# Patient Record
Sex: Female | Born: 2000
Health system: Southern US, Community
[De-identification: ages and names within clinical notes are randomized; demographics above are authoritative.]

## PROBLEM LIST (undated history)

## (undated) ENCOUNTER — Emergency Department (HOSPITAL_COMMUNITY): Admission: EM | Payer: Federal, State, Local not specified - PPO | Source: Home / Self Care

## (undated) DIAGNOSIS — F329 Major depressive disorder, single episode, unspecified: Secondary | ICD-10-CM

## (undated) DIAGNOSIS — R51 Headache: Secondary | ICD-10-CM

## (undated) DIAGNOSIS — Z9109 Other allergy status, other than to drugs and biological substances: Secondary | ICD-10-CM

## (undated) DIAGNOSIS — T7840XA Allergy, unspecified, initial encounter: Secondary | ICD-10-CM

## (undated) DIAGNOSIS — R519 Headache, unspecified: Secondary | ICD-10-CM

## (undated) DIAGNOSIS — F909 Attention-deficit hyperactivity disorder, unspecified type: Secondary | ICD-10-CM

## (undated) DIAGNOSIS — L309 Dermatitis, unspecified: Secondary | ICD-10-CM

## (undated) DIAGNOSIS — F419 Anxiety disorder, unspecified: Secondary | ICD-10-CM

## (undated) DIAGNOSIS — F32A Depression, unspecified: Secondary | ICD-10-CM

## (undated) HISTORY — DX: Headache, unspecified: R51.9

## (undated) HISTORY — DX: Headache: R51

## (undated) HISTORY — PX: UMBILICAL HERNIA REPAIR: SHX196

## (undated) HISTORY — DX: Anxiety disorder, unspecified: F41.9

---
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
## 2022-01-11 ENCOUNTER — Ambulatory Visit: Payer: Federal, State, Local not specified - PPO | Admitting: Allergy

## 2022-02-01 NOTE — Progress Notes (Deleted)
10/04/2021  FOLLOW UP Date of Service/Encounter:  02/01/22   Subjective:  [name] (DOB: 27-Dec-2000) is a 21 y.o. female who returns to the [facility] [place] on 02/04/2022 in re-evaluation of the following:  severe asthma, allergic rhinitis with conjunctivitis and food allergy History obtained from: chart review and {Persons; PED relatives w/patient:19415::"patient"}.  For Review, LV was on 10/04/2021 with Dr. Nelva Bush seen for routine follow-up.  This is my first time meeting this patient.  At that visit her asthma had not been controlled and she had had 3 ED visits, 2 requiring subsequent hospitalization for asthma exacerbations.  Taking Nucala monthly injections and Breztri had become unaffordable.  Avoiding peanuts and tree nuts.  Allergic rhinitis controlled. It was recommended that she switch to Tezspire at that visit.  Interim history Per chart review: Patient was approved for test prior but never returned for any biologic treatment since March 2023.  Was a no-show for her appointment on 01/11/2022. Has had 3 ED visits-1 for constipation, 1 for migraine headache, and 1 for severe asthma on 11/02/2021.  Given magnesium, methylprednisolone, leave albuterol, and albuterol.  DuoNeb's x2 with EMS prior to arrival.  Wheezing noted on exam.  Hx of peanut/treenut allergy:  Hx of bee allergy: **  Today presents for follow-up. ***  Allergies as of 02/04/2022       Reactions   Bee Venom Shortness Of Breath   Eggs Or Egg-derived Products Shortness Of Breath   Other Anaphylaxis, Other (See Comments)   Nuts and Tree nuts Pet dander cats and dogs   Peanut-containing Drug Products Anaphylaxis   Pollen Extract Swelling        Medication List        Accurate as of February 01, 2022  5:08 PM. If you have any questions, ask your nurse or doctor.          Breztri Aerosphere 160-9-4.8 MCG/ACT Aero Generic drug: Budeson-Glycopyrrol-Formoterol Inhale 2 puffs into the lungs  in the morning and at bedtime.   Breztri Aerosphere 160-9-4.8 MCG/ACT Aero Generic drug: Budeson-Glycopyrrol-Formoterol Inhale 2 puffs into the lungs in the morning and at bedtime.   cetirizine 10 MG tablet Commonly known as: ZYRTEC Take 1 tablet (10 mg total) by mouth at bedtime.   cetirizine 10 MG tablet Commonly known as: ZYRTEC TAKE 1 TABLET BY MOUTH EVERY DAY   EPINEPHrine 0.3 mg/0.3 mL Soaj injection Commonly known as: EPI-PEN Inject 0.3 mg into the muscle as needed for anaphylaxis.   EpiPen 2-Pak 0.3 mg/0.3 mL Soaj injection Generic drug: EPINEPHrine Inject 0.3 mg into the muscle as needed for anaphylaxis.   fluticasone 50 MCG/ACT nasal spray Commonly known as: FLONASE Place 2 sprays into both nostrils daily.   fluticasone 50 MCG/ACT nasal spray Commonly known as: FLONASE 2 sprays per nostril daily for 1-2 weeks at a time before stopping once nasal congestion improves.   guanFACINE 1 MG Tb24 ER tablet Commonly known as: INTUNIV Take 1 tablet (1 mg total) by mouth at bedtime.   hydrOXYzine 25 MG tablet Commonly known as: ATARAX Take 1 tablet (25 mg total) by mouth 3 (three) times daily as needed for anxiety.   ipratropium-albuterol 0.5-2.5 (3) MG/3ML Soln Commonly known as: DUONEB Take it 2 times scheduled and every 8 hours as needed if needed for shortness of breath and wheezing.   mepolizumab 100 MG injection Commonly known as: NUCALA Inject 100 mg into the skin every 28 (twenty-eight) days.   olopatadine 0.1 % ophthalmic solution Commonly  known as: PATANOL Place 1 drop into both eyes 2 (two) times daily as needed for allergies (Itchy, watery eyes).   polyethylene glycol 17 g packet Commonly known as: MIRALAX / GLYCOLAX Take 17 g by mouth daily.   Ventolin HFA 108 (90 Base) MCG/ACT inhaler Generic drug: albuterol INHALE 2 PUFFS INTO THE LUNGS EVERY 4 HOURS AS NEEDED FOR WHEEZING OR SHORTNESS OF BREATH.   Vyvanse 60 MG capsule Generic drug:  lisdexamfetamine Take 1 capsule (60 mg total) by mouth every morning.       Past Medical History:  Diagnosis Date   ADHD    Allergy    Anxiety    Asthma    Depression    Eczema    Environmental allergies    Headache    Past Surgical History:  Procedure Laterality Date   GANGLION CYST EXCISION Right 04/26/2021   Procedure: right dorsal carpal ganglion cyst excision;  Surgeon: Orene Desanctis, MD;  Location: [place];  Service: Orthopedics;  Laterality: Right;  with MAC anesthesia needs 30 minutes   INCISION AND DRAINAGE WOUND WITH NAILBED REPAIR Left 04/26/2021   Procedure: INCISION AND DRAINAGE WOUND WITH NAILBED REPAIR LEFT MIDDLE FINGER;  Surgeon: Orene Desanctis, MD;  Location: [place];  Service: Orthopedics;  Laterality: Left;   UMBILICAL HERNIA REPAIR     Otherwise, there have been no changes to her past medical history, surgical history, family history, or social history.  ROS: All others negative except as noted per HPI.   Objective:  There were no vitals taken for this visit. There is no height or weight on file to calculate BMI. Physical Exam: General Appearance:  Alert, cooperative, no distress, appears stated age  Head:  Normocephalic, without obvious abnormality, atraumatic  Eyes:  Conjunctiva clear, EOM's intact  Nose: Nares normal, {Blank multiple:19196:a:"***","hypertrophic turbinates","normal mucosa","no visible anterior polyps","septum midline"}  Throat: Lips, tongue normal; teeth and gums normal, {Blank multiple:19196:a:"***","normal posterior oropharynx","tonsils 2+","tonsils 3+","no tonsillar exudate","+ cobblestoning"}  Neck: Supple, symmetrical  Lungs:   {Blank multiple:19196:a:"***","clear to auscultation bilaterally","end-expiratory wheezing","wheezing throughout"}, Respirations unlabored, {Blank multiple:19196:a:"***","no coughing","intermittent dry coughing"}  Heart:  {Blank multiple:19196:a:"***","regular rate and rhythm","no murmur"}, Appears well perfused   Extremities: No edema  Skin: Skin color, texture, turgor normal, no rashes or lesions on visualized portions of skin  Neurologic: No gross deficits   Reviewed: ***  Spirometry:  Tracings reviewed. Her effort: {Blank single:19197::"Good reproducible efforts.","It was hard to get consistent efforts and there is a question as to whether this reflects a maximal maneuver.","Poor effort, data can not be interpreted.","Variable effort-results affected.","decent for first attempt at spirometry."} FVC: ***L FEV1: ***L, ***% predicted FEV1/FVC ratio: ***% Interpretation: {Blank single:19197::"Spirometry consistent with mild obstructive disease","Spirometry consistent with moderate obstructive disease","Spirometry consistent with severe obstructive disease","Spirometry consistent with possible restrictive disease","Spirometry consistent with mixed obstructive and restrictive disease","Spirometry uninterpretable due to technique","Spirometry consistent with normal pattern","No overt abnormalities noted given today's efforts"}.  Please see scanned spirometry results for details.  Skin Testing: {Blank single:19197::"Select foods","Environmental allergy panel","Environmental allergy panel and select foods","Food allergy panel","None","Deferred due to recent antihistamines use","deferred due to recent reaction"}. ***Adequate positive and negative controls Results discussed with patient/family.   {Blank single:19197::"Allergy testing results were read and interpreted by myself, documented by clinical staff."," "}  Assessment/Plan   ***  Sigurd Sos, MD  [facility] [address] [facility]

## 2022-02-04 ENCOUNTER — Ambulatory Visit: Payer: Federal, State, Local not specified - PPO | Admitting: Internal Medicine

## 2022-02-09 ENCOUNTER — Emergency Department (HOSPITAL_BASED_OUTPATIENT_CLINIC_OR_DEPARTMENT_OTHER)
Admission: EM | Admit: 2022-02-09 | Discharge: 2022-02-09 | Disposition: A | Discharge status: Home / Self Care | Payer: Federal, State, Local not specified - PPO | Attending: Emergency Medicine | Admitting: Emergency Medicine

## 2022-02-09 ENCOUNTER — Other Ambulatory Visit: Payer: Self-pay

## 2022-02-09 ENCOUNTER — Encounter (HOSPITAL_BASED_OUTPATIENT_CLINIC_OR_DEPARTMENT_OTHER): Payer: Self-pay | Admitting: Emergency Medicine

## 2022-02-09 ENCOUNTER — Other Ambulatory Visit: Payer: Self-pay | Admitting: Allergy

## 2022-02-09 DIAGNOSIS — Z91010 Allergy to peanuts: Secondary | ICD-10-CM | POA: Insufficient documentation

## 2022-02-09 DIAGNOSIS — J45909 Unspecified asthma, uncomplicated: Secondary | ICD-10-CM | POA: Insufficient documentation

## 2022-02-09 DIAGNOSIS — Z7951 Long term (current) use of inhaled steroids: Secondary | ICD-10-CM | POA: Diagnosis not present

## 2022-02-09 MED ORDER — PREDNISONE 50 MG PO TABS: ORAL_TABLET | ORAL | 0 refills | Status: DC

## 2022-02-09 MED ORDER — IPRATROPIUM-ALBUTEROL 0.5-2.5 (3) MG/3ML IN SOLN
RESPIRATORY_TRACT | Status: AC
  Administered 2022-02-09: 3 mL via RESPIRATORY_TRACT
  Filled 2022-02-09: qty 3

## 2022-02-09 MED ORDER — PREDNISONE 50 MG PO TABS
60.0000 mg | ORAL_TABLET | Freq: Once | ORAL | Status: AC
  Administered 2022-02-09: 60 mg via ORAL

## 2022-02-09 MED ORDER — PREDNISONE 50 MG PO TABS
60.0000 mg | ORAL_TABLET | Freq: Every day | ORAL | Status: DC
  Filled 2022-02-09: qty 1

## 2022-02-09 MED ORDER — ALBUTEROL SULFATE HFA 108 (90 BASE) MCG/ACT IN AERS: 2.0000 | INHALATION_SPRAY | Freq: Four times a day (QID) | RESPIRATORY_TRACT | 2 refills | Status: DC | PRN

## 2022-02-09 MED ORDER — IPRATROPIUM-ALBUTEROL 0.5-2.5 (3) MG/3ML IN SOLN: 3.0000 mL | Freq: Once | RESPIRATORY_TRACT | Status: AC

## 2022-02-09 MED ORDER — ALBUTEROL SULFATE HFA 108 (90 BASE) MCG/ACT IN AERS
2.0000 | INHALATION_SPRAY | RESPIRATORY_TRACT | Status: DC
  Administered 2022-02-09: 2 via RESPIRATORY_TRACT
  Filled 2022-02-09: qty 6.7

## 2022-02-09 NOTE — ED Provider Notes (Signed)
MEDCENTER [facility] EMERGENCY DEPT Provider Note   CSN: [id number] Arrival date & time: 02/09/22  1358     History  Chief Complaint  Patient presents with   Asthma    Jacqueline [name] is a 21 y.o. female.  Pt complains of having an asthma attack.  Pt reports she ran out of her albuterol inhaler.  Patient was given a breathing treatment by respiratory therapy prior to my evaluation she states that she feels back to normal.  Patient reports she feels like she may have an upper respiratory infection that triggered her asthma attack.  She reports she is out of her other inhaler as well.  She states she cannot afford to get it.  Patient states she can contact her doctor on Monday who normally gives her samples.  The history is provided by the patient. No language interpreter was used.  Asthma This is a recurrent problem. The problem occurs constantly. Nothing aggravates the symptoms. Nothing relieves the symptoms.       Home Medications Prior to Admission medications   Medication Sig Start Date End Date Taking? Authorizing Provider  albuterol (VENTOLIN HFA) 108 (90 Base) MCG/ACT inhaler Inhale 2 puffs into the lungs every 6 (six) hours as needed for wheezing or shortness of breath. 02/09/22  Yes Harli Engelken K, PA-C  BREZTRI AEROSPHERE 160-9-4.8 MCG/ACT AERO Inhale 2 puffs into the lungs in the morning and at bedtime. Patient not taking: Reported on 10/04/2021 05/03/21   Marcelyn Bruins, MD  BREZTRI AEROSPHERE 160-9-4.8 MCG/ACT AERO Inhale 2 puffs into the lungs in the morning and at bedtime. 10/04/21   Marcelyn Bruins, MD  cetirizine (ZYRTEC) 10 MG tablet Take 1 tablet (10 mg total) by mouth at bedtime. 09/16/21 12/15/21  Leroy Sea, MD  cetirizine (ZYRTEC) 10 MG tablet TAKE 1 TABLET BY MOUTH EVERY DAY 10/22/21   Marcelyn Bruins, MD  EPINEPHrine 0.3 mg/0.3 mL IJ SOAJ injection Inject 0.3 mg into the muscle as needed for anaphylaxis. 05/03/21    Marcelyn Bruins, MD  EPIPEN 2-PAK 0.3 MG/0.3ML SOAJ injection Inject 0.3 mg into the muscle as needed for anaphylaxis. 10/04/21   Marcelyn Bruins, MD  fluticasone (FLONASE) 50 MCG/ACT nasal spray Place 2 sprays into both nostrils daily. 07/29/21   [provider]  fluticasone (FLONASE) 50 MCG/ACT nasal spray 2 sprays per nostril daily for 1-2 weeks at a time before stopping once nasal congestion improves. 10/04/21   Marcelyn Bruins, MD  guanFACINE (INTUNIV) 1 MG TB24 ER tablet Take 1 tablet (1 mg total) by mouth at bedtime. Patient not taking: Reported on 09/15/2021 05/21/21   Karsten Ro, MD  hydrOXYzine (ATARAX/VISTARIL) 25 MG tablet Take 1 tablet (25 mg total) by mouth 3 (three) times daily as needed for anxiety. 09/01/18   Verneda Skill, FNP  ipratropium-albuterol (DUONEB) 0.5-2.5 (3) MG/3ML SOLN Take it 2 times scheduled and every 8 hours as needed if needed for shortness of breath and wheezing. 09/16/21   Leroy Sea, MD  mepolizumab (NUCALA) 100 MG injection Inject 100 mg into the skin every 28 (twenty-eight) days.    [provider]  olopatadine (PATANOL) 0.1 % ophthalmic solution Place 1 drop into both eyes 2 (two) times daily as needed for allergies (Itchy, watery eyes). 10/04/21   Marcelyn Bruins, MD  polyethylene glycol (MIRALAX / GLYCOLAX) 17 g packet Take 17 g by mouth daily. 10/22/21   Horton, Mayer Masker, MD  VYVANSE 60 MG capsule Take 1  capsule (60 mg total) by mouth every morning. 05/21/21   Karsten Ro, MD      Allergies    Bee venom, Eggs or egg-derived products, Other, Peanut-containing drug products, and Pollen extract    Review of Systems   Review of Systems  All other systems reviewed and are negative.   Physical Exam Updated Vital Signs BP 117/81   Pulse 99   Temp 98.8 F (37.1 C)   Resp 18   SpO2 98%  Physical Exam Vitals and nursing note reviewed.  Constitutional:      Appearance: She is  well-developed.  HENT:     Head: Normocephalic.     Mouth/Throat:     Mouth: Mucous membranes are moist.  Cardiovascular:     Rate and Rhythm: Normal rate and regular rhythm.  Pulmonary:     Effort: Pulmonary effort is normal.  Abdominal:     General: Abdomen is flat. There is no distension.  Musculoskeletal:        General: Normal range of motion.     Cervical back: Normal range of motion.  Skin:    General: Skin is warm.  Neurological:     Mental Status: She is alert and oriented to person, place, and time.     ED Results / Procedures / Treatments   Labs (all labs ordered are listed, but only abnormal results are displayed) Labs Reviewed - No data to display  EKG None  Radiology No results found.  Procedures Procedures    Medications Ordered in ED Medications  predniSONE (DELTASONE) tablet 60 mg (has no administration in time range)  albuterol (VENTOLIN HFA) 108 (90 Base) MCG/ACT inhaler 2 puff (has no administration in time range)  ipratropium-albuterol (DUONEB) 0.5-2.5 (3) MG/3ML nebulizer solution 3 mL (3 mLs Nebulization Given 02/09/22 1427)    ED Course/ Medical Decision Making/ A&P                           Medical Decision Making Patient complains of an asthma attack  Amount and/or Complexity of Data Reviewed External Data Reviewed: notes.    Details: Notes reviewed from patient's allergist  Risk Prescription drug management. Risk Details: Patient is feeling back to normal after receiving nebulization here patient is given an albuterol inhaler here.  Given 60 mg of prednisone patient is given prednisone 50 mg x 5 days.  She is advised to follow-up with her physician for recheck next week.           Final Clinical Impression(s) / ED Diagnoses Final diagnoses:  Moderate asthma without complication, unspecified whether persistent    Rx / DC Orders ED Discharge Orders          Ordered    albuterol (VENTOLIN HFA) 108 (90 Base) MCG/ACT  inhaler  Every 6 hours PRN        02/09/22 1641    predniSONE (DELTASONE) 50 MG tablet        02/09/22 1646              Elson Areas, [place] 02/09/22 1646    Rolan Bucco, MD 02/09/22 [phone]

## 2022-02-09 NOTE — ED Triage Notes (Signed)
Pt has been having exacerbation of her asthma for 2 days and her inhalers are not helping.sore throat

## 2022-02-09 NOTE — Discharge Instructions (Addendum)
See your Physicain for recheck this week  

## 2022-02-14 ENCOUNTER — Ambulatory Visit (INDEPENDENT_AMBULATORY_CARE_PROVIDER_SITE_OTHER): Payer: Federal, State, Local not specified - PPO | Admitting: Pulmonary Disease

## 2022-02-14 ENCOUNTER — Encounter: Payer: Self-pay | Admitting: Pulmonary Disease

## 2022-02-14 VITALS — BP 118/72 | HR 97 | Temp 98.4°F | Ht 62.0 in | Wt 178.6 lb

## 2022-02-14 DIAGNOSIS — J4550 Severe persistent asthma, uncomplicated: Secondary | ICD-10-CM

## 2022-02-14 MED ORDER — BREZTRI AEROSPHERE 160-9-4.8 MCG/ACT IN AERO: 2.0000 | INHALATION_SPRAY | Freq: Two times a day (BID) | RESPIRATORY_TRACT | 0 refills | Status: DC

## 2022-02-14 NOTE — Progress Notes (Deleted)
Anxiety increases flares Smoked starting 16, stopped smoking 2 months ago. 1 black and mild per day No vaping, other substances  Albuterol >5x day Has been out of breztri for 2-3 months, can't get at her pharmacy due to cost. Had samples and missed that appointment so hasn't been able to get more [place]", moving in early January is her hope [place] Having trouble with nucala due to insurance. Had samples for a bit but no longer. Has been since April/may Not doing tezspire Nucala did help when breztri was getting as it should  Never intubated 4-5/7 days per week, not really having problems at night symptom wise  Has severe allergies, has an epi pen Does have problems with GI upset  Mom has COPD. 13 siblings, all have asthma. Bad in dads side too.  Triggers: weather change, worse when it gets hot. Environmental allergies. Can exercise but tolerance is not great.  If well medicated cold do 40m-hr cardio. Now could tolerate walking.   Cough and wheezing a lot, increased sputum production.  Doesn't get resp infections a lot. Typically happens only during season change.   Didn't get covid shots, hasn't gotten flu shot yet (of course its July)

## 2022-02-14 NOTE — Patient Instructions (Signed)
Thank you for visiting Dr. Tonia Brooms at [facility]. Today we recommend the following:  Breztri samples  [place] application financial aid application  Tezepelumab application and financial aid  Return in about 6 weeks (around 03/28/2022).    Please do your part to reduce the spread of COVID-19.

## 2022-02-14 NOTE — Progress Notes (Signed)
Synopsis: Referred in February 2023 for severe persistent asthma by Dr. Candiss Norse  Subjective:   PATIENT ID: Jacqueline [name] GENDER: female DOB: [birth year], MRN: [id number]  Chief Complaint  Patient presents with   Consult    She was told she was born with Asthma, she has come chest tightness, shortness of breath with exertion and wheezing.     Patient presents today for a referral due to severe persistent asthma. She has been evaluated in the emergency department seven times over the last 1 year for her asthma.   She smoked around 1 black and mild daily from age 58 until around two months ago. She denies vape or other substance use.   Jacqueline [name] explains that when she has asthma flares she also gets very anxious and often goes to the hospital because the anxiety makes her breathing during the flare much worse. She was on Tezpire at one point in time which was not very helpful in symptom management. She then was on Nucala injections but because she wasn't following up with her allergy/asthma clinic regularly, her insurance stopped covering it. Since she has been making an effort to go more regularly she is still not able to get insurance coverage for the treatment. She has been using Breztri and albuterol though has been out of [place] for 2-3 months due to cost. She cannot afford it at her pharmacy as it is $100. She has been able to get samples in the past however she has not been to her allergy/asthma physician in some time. She is also currently out of her albuterol inhaler but was using it 5 or more times per day before it ran out. She states that the combination of Nucala and Breztri did a great job at controlling her symptoms.   She has never been intubated due to respiratory distress or failure. She reports symptoms 4 to 5 days per week with infrequent nighttime symptoms.   She has severe allergies and has an Epi Pen. She also reports gastrointestinal issues such as diarrhea. She has never  been tested for autoimmune diseases and does not have cystic fibrosis.   Her triggers are weather change, particularly from cool to warm weather, as well as environmental allergies. When well controlled she believes she could tolerate 30 minutes-1 hour of cardiovascular activity. At this time she is not adequately treated and would only be able to tolerate walking. Taking the stairs exhausts her.   She has a productive cough and increased sputum production with significant wheezing. She denies frequent respiratory infections, stating that these typically only occur in times of season change. She did not receive the COVID-19 vaccines or boosters or flu shot.   Her entire family is affected by respiratory illness. Her mom has COPD and her dad's side of the family has a heavy asthma presence. She has 13 siblings and every one of them has asthma.  She was in [employer] in high school and hopes to move to the [place] area in early January to join the [employer].Marland Kitchene    Past Medical History:  Diagnosis Date   ADHD    Allergy    Anxiety    Asthma    Depression    Eczema    Environmental allergies    Headache      Family History  Problem Relation Age of Onset   Allergic rhinitis Mother    Asthma Mother    COPD Mother    Migraines Mother  Allergic rhinitis Father    Asthma Father    Schizophrenia Father    Asthma Sister    Asthma Brother    Migraines Brother      Past Surgical History:  Procedure Laterality Date   GANGLION CYST EXCISION Right 04/26/2021   Procedure: right dorsal carpal ganglion cyst excision;  Surgeon: Orene Desanctis, MD;  Location: [facility];  Service: Orthopedics;  Laterality: Right;  with MAC anesthesia needs 30 minutes   INCISION AND DRAINAGE WOUND WITH NAILBED REPAIR Left 04/26/2021   Procedure: INCISION AND DRAINAGE WOUND WITH NAILBED REPAIR LEFT MIDDLE FINGER;  Surgeon: Orene Desanctis, MD;  Location: [facility];  Service: Orthopedics;  Laterality: Left;   UMBILICAL  HERNIA REPAIR      Social History   Socioeconomic History   Marital status: Single    Spouse name: Not on file   Number of children: Not on file   Years of education: Not on file   Highest education level: Not on file  Occupational History   Not on file  Tobacco Use   Smoking status: Former    Packs/day: 0.25    Years: 4.00    Total pack years: 1.00    Types: Cigarettes, Cigars    Quit date: 11/06/2019    Years since quitting: 2.2   Smokeless tobacco: Never   Tobacco comments:    Patient smokes black n milds  Vaping Use   Vaping Use: Never used  Substance and Sexual Activity   Alcohol use: Not Currently    Comment: has a drink occasionally   Drug use: Yes    Types: Marijuana    Comment: rarely   Sexual activity: Yes    Birth control/protection: None    Comment: Pt states she is a lesbian; no protection used   Other Topics Concern   Not on file  Social History Narrative   Lives with mother, brother, sister. Shitzu in house. 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   She attends [employer]. She does ok in school.    She enjoys ROTC and basketball.   Social Determinants of Health   Financial Resource Strain: Low Risk  (04/17/2019)   Overall Financial Resource Strain (CARDIA)    Difficulty of Paying Living Expenses: Not hard at all  Food Insecurity: No Food Insecurity (04/17/2019)   Hunger Vital Sign    Worried About Running Out of Food in the Last Year: Never true    Ran Out of Food in the Last Year: Never true  Transportation Needs: No Transportation Needs (04/17/2019)   PRAPARE - Hydrologist (Medical): No    Lack of Transportation (Non-Medical): No  Physical Activity: Unknown (04/17/2019)   Exercise Vital Sign    Days of Exercise per Week: Patient refused    Minutes of Exercise per Session: Patient refused  Stress: No Stress Concern Present (04/17/2019)   [place]     Feeling of Stress : Not at all  Social Connections: Unknown (04/17/2019)   Social Connection and Isolation Panel [NHANES]    Frequency of Communication with Friends and Family: More than three times a week    Frequency of Social Gatherings with Friends and Family: More than three times a week    Attends Religious Services: Patient refused    Active Member of Clubs or Organizations: No    Attends Archivist Meetings: Never    Marital Status: Never married  Intimate Partner Violence: Not At Risk (04/17/2019)   Humiliation, Afraid, Rape, and Kick questionnaire    Fear of Current or Ex-Partner: No    Emotionally Abused: No    Physically Abused: No    Sexually Abused: No     Allergies  Allergen Reactions   Bee Venom Shortness Of Breath   Eggs Or Egg-Derived Products Shortness Of Breath   Other Anaphylaxis and Other (See Comments)    Nuts and Tree nuts Pet dander cats and dogs   Peanut-Containing Drug Products Anaphylaxis   Pollen Extract Swelling     Outpatient Medications Prior to Visit  Medication Sig Dispense Refill   albuterol (VENTOLIN HFA) 108 (90 Base) MCG/ACT inhaler Inhale 2 puffs into the lungs every 6 (six) hours as needed for wheezing or shortness of breath. 8 g 2   cetirizine (ZYRTEC) 10 MG tablet TAKE 1 TABLET BY MOUTH EVERY DAY 90 tablet 1   EPIPEN 2-PAK 0.3 MG/0.3ML SOAJ injection Inject 0.3 mg into the muscle as needed for anaphylaxis. 1 each 1   fluticasone (FLONASE) 50 MCG/ACT nasal spray 2 sprays per nostril daily for 1-2 weeks at a time before stopping once nasal congestion improves. 1 g 5   guanFACINE (INTUNIV) 1 MG TB24 ER tablet Take 1 tablet (1 mg total) by mouth at bedtime. 30 tablet 0   hydrOXYzine (ATARAX/VISTARIL) 25 MG tablet Take 1 tablet (25 mg total) by mouth 3 (three) times daily as needed for anxiety. 30 tablet 1   ipratropium-albuterol (DUONEB) 0.5-2.5 (3) MG/3ML SOLN Take it 2 times scheduled and every 8 hours as needed if needed for  shortness of breath and wheezing. 360 mL 0   mepolizumab (NUCALA) 100 MG injection Inject 100 mg into the skin every 28 (twenty-eight) days.     olopatadine (PATANOL) 0.1 % ophthalmic solution Place 1 drop into both eyes 2 (two) times daily as needed for allergies (Itchy, watery eyes). 5 mL 5   polyethylene glycol (MIRALAX / GLYCOLAX) 17 g packet Take 17 g by mouth daily. 14 each 0   VENTOLIN HFA 108 (90 Base) MCG/ACT inhaler INHALE 2 PUFFS BY MOUTH EVERY 4 HOURS AS NEEDED FOR WHEEZE OR FOR SHORTNESS OF BREATH 18 each 2   VYVANSE 60 MG capsule Take 1 capsule (60 mg total) by mouth every morning. 30 capsule 0   BREZTRI AEROSPHERE 160-9-4.8 MCG/ACT AERO Inhale 2 puffs into the lungs in the morning and at bedtime. (Patient not taking: Reported on 02/14/2022) 10.7 g 5   BREZTRI AEROSPHERE 160-9-4.8 MCG/ACT AERO Inhale 2 puffs into the lungs in the morning and at bedtime. (Patient not taking: Reported on 02/14/2022) 10.7 g 2   cetirizine (ZYRTEC) 10 MG tablet Take 1 tablet (10 mg total) by mouth at bedtime. 90 tablet 0   EPINEPHrine 0.3 mg/0.3 mL IJ SOAJ injection Inject 0.3 mg into the muscle as needed for anaphylaxis. (Patient not taking: Reported on 02/14/2022) 4 each 2   fluticasone (FLONASE) 50 MCG/ACT nasal spray Place 2 sprays into both nostrils daily. (Patient not taking: Reported on 02/14/2022)     predniSONE (DELTASONE) 50 MG tablet One tablet a day (Patient not taking: Reported on 02/14/2022) 5 tablet 0   Facility-Administered Medications Prior to Visit  Medication Dose Route Frequency Provider Last Rate Last Admin   mepolizumab (NUCALA) injection 100 mg  100 mg Subcutaneous Q28 days Kennith Gain, MD   100 mg at 08/22/21 1445   tezepelumab-ekko (TEZSPIRE) 210 MG/1.91ML syringe 210 mg  210  mg Subcutaneous Q28 days Kennith Gain, MD   210 mg at 10/04/21 1149    Review of Systems  Constitutional:  Positive for malaise/fatigue. Negative for chills and fever.  Respiratory:   Positive for cough, sputum production, shortness of breath and wheezing.   Cardiovascular:  Positive for PND.  Gastrointestinal:  Positive for diarrhea.  Endo/Heme/Allergies:  Positive for environmental allergies.  Psychiatric/Behavioral:  The patient is nervous/anxious.      Objective:  Constitutional:Appears stated age, appears uncomfortable but in no acute distress.  Cardio:Regular rate and rhythm. No murmurs, rubs, or gallops. Pulm:Diffuse inspiratory squawking with expiratory wheezing. On room air.  ZOX:WRUEAVWU for extremity edema. No nail clubbing. Skin:Warm and dry. Neuro:Awake, alert, no focal deficit noted. Psych:Normal mood and affect.   Vitals:   02/14/22 1345  BP: 118/72  Pulse: 97  Temp: 98.4 F (36.9 C)  TempSrc: Oral  SpO2: 97%  Weight: 178 lb 9.6 oz (81 kg)  Height: _0  (1.575 m)   97% on RA BMI Readings from Last 3 Encounters:  02/14/22 32.67 kg/m (96 %, Z= 1.76)*  11/28/21 32.66 kg/m (96 %, Z= 1.77)*  11/02/21 32.66 kg/m (96 %, Z= 1.77)*   * Growth percentiles are based on CDC (Girls, 2-20 Years) data.   Wt Readings from Last 3 Encounters:  02/14/22 178 lb 9.6 oz (81 kg) (95 %, Z= 1.63)*  11/28/21 178 lb 9.2 oz (81 kg) (95 %, Z= 1.64)*  11/02/21 178 lb 9.2 oz (81 kg) (95 %, Z= 1.64)*   * Growth percentiles are based on CDC (Girls, 2-20 Years) data.     CBC    Component Value Date/Time   WBC 7.7 10/21/2021 2341   RBC 4.21 10/21/2021 2341   HGB 11.9 (L) 10/21/2021 2341   HGB 13.8 05/03/2021 1754   HCT 36.2 10/21/2021 2341   HCT 40.7 05/03/2021 1754   PLT 298 10/21/2021 2341   PLT 365 12/01/2015 1001   MCV 86.0 10/21/2021 2341   MCV 87 05/03/2021 1754   MCH 28.3 10/21/2021 2341   MCHC 32.9 10/21/2021 2341   RDW 12.3 10/21/2021 2341   RDW 13.2 05/03/2021 1754   LYMPHSABS 2.9 10/21/2021 2341   LYMPHSABS 1.6 05/03/2021 1754   MONOABS 0.5 10/21/2021 2341   EOSABS 0.1 10/21/2021 2341   EOSABS 0.1 05/03/2021 1754   BASOSABS 0.0  10/21/2021 2341   BASOSABS 0.0 05/03/2021 1754    Chest Imaging: Most recent chest x-ray in February 2023 showed no acute process.  Pulmonary Functions Testing Results:     No data to display          Echocardiogram: June 2018 -Levocardia. Normally related great arteries.  -Normal return of IVC to RA. SVC not well seen. Pulmonary veins not well seen.  -Normal right atrial size. Normal left atrial size.  -Atrial septum not well visualized.  -Normal tricuspid valve. Mild tricuspid regurgitation.  -Normal mitral valve. Normal mitral inflow. Trivial mitral regurgitation.  -Normal right ventricular size and systolic function.  -Normal left ventricular size and systolic function.  -No ventricular septal defect detected.  -Normal pulmonary valve. No pulmonary stenosis. Trivial pulmonary insufficiency.  -No aortic stenosis or insufficiency.  -Normal pulmonary arteries.  -No evidence of a patent ductus arteriosus.  -Normal appearing origin of the left coronary artery by 2D imaging, not confirmed by color Doppler imaging. Right coronary artery not well seen.  -Non-obstructive flow pattern in the aortic arch.  -No pericardial effusion.      Assessment &  Plan:     ICD-10-CM   1. Severe persistent asthma without complication  E99.37       Discussion: Jacqueline [name] presents today for severe persistent asthma. It seems that running out of her medications and non-compliance with scheduled follow-ups is contributing most to her symptoms and recurrent flares. When a flare starts she has overwhelming anxiety which further exacerbates her symptoms. She reports financial constraints for obtaining her medications which may explain appointment non-compliance as well, however I think that her age and lack of understanding of the severity of her disease is contributing most.. She is no longer smoking and has been counseled on how dangerous smoking with her disease is. -Patient counseled on  seasonal flu vaccine -Home with Breztri samples today -She will fill out financial assistance forms for [employer] and tezepelumab   Current Outpatient Medications:    albuterol (VENTOLIN HFA) 108 (90 Base) MCG/ACT inhaler, Inhale 2 puffs into the lungs every 6 (six) hours as needed for wheezing or shortness of breath., Disp: 8 g, Rfl: 2   Budeson-Glycopyrrol-Formoterol (BREZTRI AEROSPHERE) 160-9-4.8 MCG/ACT AERO, Inhale 2 puffs into the lungs in the morning and at bedtime., Disp: 10.7 g, Rfl: 0   cetirizine (ZYRTEC) 10 MG tablet, TAKE 1 TABLET BY MOUTH EVERY DAY, Disp: 90 tablet, Rfl: 1   EPIPEN 2-PAK 0.3 MG/0.3ML SOAJ injection, Inject 0.3 mg into the muscle as needed for anaphylaxis., Disp: 1 each, Rfl: 1   fluticasone (FLONASE) 50 MCG/ACT nasal spray, 2 sprays per nostril daily for 1-2 weeks at a time before stopping once nasal congestion improves., Disp: 1 g, Rfl: 5   guanFACINE (INTUNIV) 1 MG TB24 ER tablet, Take 1 tablet (1 mg total) by mouth at bedtime., Disp: 30 tablet, Rfl: 0   hydrOXYzine (ATARAX/VISTARIL) 25 MG tablet, Take 1 tablet (25 mg total) by mouth 3 (three) times daily as needed for anxiety., Disp: 30 tablet, Rfl: 1   ipratropium-albuterol (DUONEB) 0.5-2.5 (3) MG/3ML SOLN, Take it 2 times scheduled and every 8 hours as needed if needed for shortness of breath and wheezing., Disp: 360 mL, Rfl: 0   mepolizumab (NUCALA) 100 MG injection, Inject 100 mg into the skin every 28 (twenty-eight) days., Disp: , Rfl:    olopatadine (PATANOL) 0.1 % ophthalmic solution, Place 1 drop into both eyes 2 (two) times daily as needed for allergies (Itchy, watery eyes)., Disp: 5 mL, Rfl: 5   polyethylene glycol (MIRALAX / GLYCOLAX) 17 g packet, Take 17 g by mouth daily., Disp: 14 each, Rfl: 0   VENTOLIN HFA 108 (90 Base) MCG/ACT inhaler, INHALE 2 PUFFS BY MOUTH EVERY 4 HOURS AS NEEDED FOR WHEEZE OR FOR SHORTNESS OF BREATH, Disp: 18 each, Rfl: 2   VYVANSE 60 MG capsule, Take 1 capsule (60 mg total) by  mouth every morning., Disp: 30 capsule, Rfl: 0  Current Facility-Administered Medications:    mepolizumab (NUCALA) injection 100 mg, 100 mg, Subcutaneous, Q28 days, Kennith Gain, MD, 100 mg at 08/22/21 1445   tezepelumab-ekko (TEZSPIRE) 210 MG/1.91ML syringe 210 mg, 210 mg, Subcutaneous, Q28 days, Kennith Gain, MD, 210 mg at 10/04/21 1149  I spent 60 minutes dedicated to the care of this patient on the date of this encounter to include pre-visit review of records, face-to-face time with the patient discussing conditions above, post visit ordering of testing, clinical documentation with the electronic health record, making appropriate referrals as documented, and communicating necessary findings to members of the patients care team.   Farrel Gordon, DO  [facility] 02/14/2022 2:47 PM

## 2022-02-14 NOTE — Progress Notes (Signed)
PCCM attending:  This is an 21 year old female, past medical history of asthma.  Diagnosed with asthma as a child.  She was on Breztri in the past.  She had been on Nucala in the past as well as tezepelumab.  Rarely uses her albuterol.  We had a long conversation today in the office.  She does have coverage with insurance and has been able to get inhalers before in the past for free.  It seems like there is a significant compliance issue with her taking her medications.  She has very little understanding of the importance of taking her maintenance medications.  She had multiple exacerbations as well as ER visits.  She has an IgE of greater than 3000 on previous labs a couple years ago.  BP 118/72 (BP Location: Left Arm, Cuff Size: Normal)   Pulse 97   Temp 98.4 F (36.9 C) (Oral)   Ht 5\' 2"  (1.575 m)   Wt 178 lb 9.6 oz (81 kg)   LMP  (LMP Unknown)   SpO2 97%   BMI 32.67 kg/m   General: Young female no distress HEENT: NCAT, tracking appropriately Heart: Regular rate rhythm S1-S2 Lungs: Inspiratory squak, inspiratory honks,, faint right upper lobe wheeze  Labs: Reviewed Chest x-ray 09/15/2021 no acute abnormality.  Assessment: Severe uncontrolled asthma Has been on Biologics in the past, triple therapy inhaler regimen Noncompliance with medications have been issues also unable to afford medications have been issues  Plan: Breztri samples New prescription for 09/17/2021 and me financial aid application Tezepelumab application and financial aid application. Hopefully we can get her on some controlling medications and prevent her from having these multiple exacerbations and ER visits.  She is agreeable to fill out the paperwork to help obtain medicines.  I agree with documentation from [employer], DO, PGY 2.  I spent 62 minutes dedicated to the care of this patient on the date of this encounter to include pre-visit review of records, face-to-face time with the patient  discussing conditions above, post visit ordering of testing, clinical documentation with the electronic health record, making appropriate referrals as documented, and communicating necessary findings to members of the patients care team.    Champ Mungo, DO [facility] 02/14/2022 4:25 PM

## 2022-02-18 ENCOUNTER — Other Ambulatory Visit (HOSPITAL_COMMUNITY): Payer: Self-pay

## 2022-02-18 ENCOUNTER — Telehealth: Payer: Self-pay

## 2022-02-18 NOTE — Telephone Encounter (Signed)
Received New start paperwork for TEZSPIRE. Will update as we work through the benefits process.  Attempted to submit a Prior Authorization request to [facility] for TEZSPIRE via CoverMyMeds, however request was cancelled as a PA is not required.  Key: [id number]   Will complete paperwork and submit to enroll pt into copay card program.

## 2022-02-20 ENCOUNTER — Other Ambulatory Visit (HOSPITAL_COMMUNITY): Payer: Self-pay

## 2022-02-20 NOTE — Telephone Encounter (Unsigned)
Submitted Patient Assistance Application to  [employer]  for TEZSPIRE along with provider portion, PA and income documents. Will update patient when we receive a response.  Fax# [phone] Phone# [phone] 

## 2022-02-21 NOTE — Telephone Encounter (Signed)
Received fax from [employer] confirming receipt of forms and that benefits verification is in process  Chesley Mires, PharmD, MPH, BCPS, CPP Clinical Pharmacist (Rheumatology and Pulmonology)

## 2022-02-22 NOTE — Telephone Encounter (Signed)
Will hold on sending further refills of Tezspire to [facility] until new start visit is scheduled and completed  Chesley Mires, PharmD, MPH, BCPS, CPP Clinical Pharmacist (Rheumatology and Pulmonology)

## 2022-02-22 NOTE — Telephone Encounter (Signed)
Received a fax from   [employer]  regarding a successful enrollment into the [employer] program for TEZSPIRE despite application being submitted indicating enrollment into copay card program.   [facility] as we also received a fax from them requesting additional refills.  Reached out to [employer] [facility] with [employer]) and LVM requesting additional assistance with this matter. Direct phone number provided, will await return call.  Phone# [phone]

## 2022-03-04 NOTE — Telephone Encounter (Signed)
Received response from [employer] regarding pt's status in program--despite faxes we received stating pt was NOT enrolled for copay card, she apparently was at some point and has been contacted and provided consent to be sent the card in her email. [employer] does not provide card info directly to the providers office, as the copay card itself is also a debit card.  Reached out to pt to gain this info, pt's number rang and went to busy tone without option to LVM. Called and LVM on pt's mom's phone requesting call back. Will await f/u.

## 2022-03-06 ENCOUNTER — Telehealth: Payer: Self-pay | Admitting: Pulmonary Disease

## 2022-03-06 NOTE — Telephone Encounter (Signed)
[facility] is requesting refills on Tezspire. They state that they are faxing a request to the office  Please advise

## 2022-03-06 NOTE — Telephone Encounter (Signed)
Once again reached out to both pt and mother; pt's phone rings 4 times before going to a "busy tone" rather than connecting to a VM box, LVM on mother's phone once again requesting a return call.

## 2022-03-07 NOTE — Telephone Encounter (Signed)
Refills will only be sent after patient has started Tezspire in clinic with 30 minutes monitoring period. Patient has not yet returned calls to pharmacy team  Chesley Mires, PharmD, MPH, BCPS, CPP Clinical Pharmacist (Rheumatology and Pulmonology)

## 2022-03-08 ENCOUNTER — Telehealth: Payer: Self-pay | Admitting: Pulmonary Disease

## 2022-03-08 NOTE — Telephone Encounter (Signed)
[facility] is needing a new prescription for Tezspire.  Please advise   Thank you

## 2022-03-11 NOTE — Telephone Encounter (Signed)
Returned call to [facility] and advised that we will not be sending any Tezspire refills until patient starts treatment in clinic. Per rep, refill request was triggered by insurance approval but they have also not received any phone call back from patient.  Chesley Mires, PharmD, MPH, BCPS, CPP Clinical Pharmacist (Rheumatology and Pulmonology)

## 2022-03-11 NOTE — Telephone Encounter (Signed)
Attempted to enroll patient into copay card online. System runs into error. Per rep on phone, patient has two profiles. Rep will request they be merged so that copay card information is available in portal. Will check portal in 48 hours  Phone: [phone], option 2  Chesley Mires, PharmD, MPH, BCPS, CPP Clinical Pharmacist (Rheumatology and Pulmonology)

## 2022-03-15 NOTE — Telephone Encounter (Signed)
Called [employer] for update on patient's portal profile  Phone: [phone], option 2, option 1  Patient is actively enrolled in [place] copay card Gloris Ham of 02/22/22): ID: [id number] BIN: [id number] PCN: [id number] Group: [id number]  New start visit is pending Tezspire pen sample  Chesley Mires, PharmD, MPH, BCPS, CPP Clinical Pharmacist (Rheumatology and Pulmonology)

## 2022-03-26 NOTE — Telephone Encounter (Signed)
Spoke with patient regarding Tezspire approval. She states she is comfortable with self-administering injection. Patient is scheduled for Tezspire new start on 04/01/22 - before her appointment with Dr. Vicente Males, PharmD, MPH, BCPS, CPP Clinical Pharmacist (Rheumatology and Pulmonology)

## 2022-03-27 NOTE — Progress Notes (Deleted)
HPI Patient presents today to [facility] to see pharmacy team for [facility] new start.  Past medical history includes ***  Last seen by Dr. Tonia Brooms on 02/14/22. Asthma poorly controlled with multiple ED visits for asthma exacerbations in the past six months. This is secondary to loss of insurance and lack of compliance to maintenance regimen.  Number of hospitalizations in past year: Number of ***COPD/asthma exacerbations in past year:   Respiratory Medications Current regimen: *** Tried in past: *** Patient reports {Adherence challenges yes no:3044014::"adherence challenges","no known adherence challenges"}  OBJECTIVE Allergies  Allergen Reactions   Bee Venom Shortness Of Breath   Eggs Or Egg-Derived Products Shortness Of Breath   Other Anaphylaxis and Other (See Comments)    Nuts and Tree nuts Pet dander cats and dogs   Peanut-Containing Drug Products Anaphylaxis   Pollen Extract Swelling    Outpatient Encounter Medications as of 04/01/2022  Medication Sig Note   albuterol (VENTOLIN HFA) 108 (90 Base) MCG/ACT inhaler Inhale 2 puffs into the lungs every 6 (six) hours as needed for wheezing or shortness of breath.    Budeson-Glycopyrrol-Formoterol (BREZTRI AEROSPHERE) 160-9-4.8 MCG/ACT AERO Inhale 2 puffs into the lungs in the morning and at bedtime.    cetirizine (ZYRTEC) 10 MG tablet TAKE 1 TABLET BY MOUTH EVERY DAY    EPIPEN 2-PAK 0.3 MG/0.3ML SOAJ injection Inject 0.3 mg into the muscle as needed for anaphylaxis.    fluticasone (FLONASE) 50 MCG/ACT nasal spray 2 sprays per nostril daily for 1-2 weeks at a time before stopping once nasal congestion improves.    guanFACINE (INTUNIV) 1 MG TB24 ER tablet Take 1 tablet (1 mg total) by mouth at bedtime.    hydrOXYzine (ATARAX/VISTARIL) 25 MG tablet Take 1 tablet (25 mg total) by mouth 3 (three) times daily as needed for anxiety.    ipratropium-albuterol (DUONEB) 0.5-2.5 (3) MG/3ML SOLN Take it 2 times scheduled and every 8 hours  as needed if needed for shortness of breath and wheezing.    mepolizumab (NUCALA) 100 MG injection Inject 100 mg into the skin every 28 (twenty-eight) days.    olopatadine (PATANOL) 0.1 % ophthalmic solution Place 1 drop into both eyes 2 (two) times daily as needed for allergies (Itchy, watery eyes).    polyethylene glycol (MIRALAX / GLYCOLAX) 17 g packet Take 17 g by mouth daily.    VENTOLIN HFA 108 (90 Base) MCG/ACT inhaler INHALE 2 PUFFS BY MOUTH EVERY 4 HOURS AS NEEDED FOR WHEEZE OR FOR SHORTNESS OF BREATH    VYVANSE 60 MG capsule Take 1 capsule (60 mg total) by mouth every morning. 07/25/2021: Need refills   Facility-Administered Encounter Medications as of 04/01/2022  Medication   mepolizumab (NUCALA) injection 100 mg   tezepelumab-ekko (TEZSPIRE) 210 MG/1. syringe 210 mg     Immunization History  Administered Date(s) Administered   Influenza-Unspecified 04/21/2021     PFTs     No data to display           Eosinophils Most recent blood eosinophil count was *** cells/microL taken on ***.   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 This blocks TSLP from its effect on inflammation including reduce eosinophils, IgE, FeNO, IL-5, and IL-13. Mechanism is not definitively established. Reviewed that Dorothea Ogle is add-on medication and patient must continue maintenance inhaler regimen. Response to therapy: may take 3-4 months to determine efficacy.  Side  effects: injection site reaction (6-18%), antibody development (2%), arthralgia (4%), back pain (4%), pharyngitis (4%)  Dose: Tezspire 210 mg once every 4 weeks  Administration/Storage:  Reviewed administration sites of thigh or abdomen (at least 2-3 inches away from abdomen). Reviewed the upper arm is only appropriate if caregiver is administering injection  Do not shake pen/syringe as this could lead to  product foaming or precipitation. Do not shake syringe as this could lead to product foaming or precipitation.  Access: Approval of Tezspire through: {specialtycoverage:25706} Patient enrolled into copay card program to help with copay assistance.  Patient self-administered Tezspire 210mg /1.91 ml in *** using sample Tezspire 210mg /1.91 ml Autoinjector pen NDC: *** Lot: *** Expiration: ***  Patient monitored for 30 minutes for adverse reaction.  Patient tolerated ***. Injection site checked and no ***.  Medication Reconciliation  A drug regimen assessment was performed, including review of allergies, interactions, disease-state management, dosing and immunization history. Medications were reviewed with the patient, including name, instructions, indication, goals of therapy, potential side effects, importance of adherence, and safe use.  Drug interaction(s): ***  Immunizations  Patient is indicated for the influenzae, pneumonia, and shingles vaccinations. Patient has received *** COVID19 vaccines.   PLAN Continue Tezspire 210mg  SQ every 28 days.  Rx sent to: {SpecialtyPharmacies:25705}.   Continue maintenance asthma regimen of: ***  All questions encouraged and answered.  Instructed patient to reach out with any further questions or concerns.  Thank you for allowing pharmacy to participate in this patient's care.  This appointment required *** minutes of patient care (this includes precharting, chart review, review of results, face-to-face care, etc.).   , PharmD, MPH, BCPS, CPP Clinical Pharmacist (Rheumatology and Pulmonology)

## 2022-04-01 ENCOUNTER — Ambulatory Visit: Payer: Federal, State, Local not specified - PPO | Admitting: Pulmonary Disease

## 2022-04-01 ENCOUNTER — Other Ambulatory Visit: Payer: Federal, State, Local not specified - PPO | Admitting: Pharmacist

## 2022-04-01 NOTE — Progress Notes (Deleted)
Synopsis: Referred in February 2023 for severe persistent asthma by Dr. Candiss Norse  Subjective:   PATIENT ID: Jacqueline [name] GENDER: female DOB: 08-13-2000, MRN: [id number]  No chief complaint on file.   Patient presents today for a referral due to severe persistent asthma. She has been evaluated in the emergency department seven times over the last 1 year for her asthma. She smoked around 1 black and mild daily from age 21 until around two months ago. She denies vape or other substance use.   Jacqueline [name] explains that when she has asthma flares she also gets very anxious and often goes to the hospital because the anxiety makes her breathing during the flare much worse. She was on Tezpire at one point in time which was not very helpful in symptom management. She then was on Nucala injections but because she wasn't following up with her allergy/asthma clinic regularly, her insurance stopped covering it. Since she has been making an effort to go more regularly she is still not able to get insurance coverage for the treatment. She has been using Breztri and albuterol though has been out of [place] for 2-3 months due to cost. She cannot afford it at her pharmacy as it is $100. She has been able to get samples in the past however she has not been to her allergy/asthma physician in some time. She is also currently out of her albuterol inhaler but was using it 5 or more times per day before it ran out. She states that the combination of Nucala and Breztri did a great job at controlling her symptoms. She has never been intubated due to respiratory distress or failure. She reports symptoms 4 to 5 days per week with infrequent nighttime symptoms. She has severe allergies and has an Epi Pen. She also reports gastrointestinal issues such as diarrhea. She has never been tested for autoimmune diseases and does not have cystic fibrosis.   Her triggers are weather change, particularly from cool to warm weather, as well  as environmental allergies. When well controlled she believes she could tolerate 30 minutes-1 hour of cardiovascular activity. At this time she is not adequately treated and would only be able to tolerate walking. Taking the stairs exhausts her. She has a productive cough and increased sputum production with significant wheezing. She denies frequent respiratory infections, stating that these typically only occur in times of season change. She did not receive the COVID-19 vaccines or boosters or flu shot.  Her entire family is affected by respiratory illness. Her mom has COPD and her dad's side of the family has a heavy asthma presence. She has 13 siblings and every one of them has asthma. She was in [employer] in high school and hopes to move to the [place] area in early January to join the [employer].  OV 04/01/2022:Here today for follow-up regarding asthma.  Looks like we have tried to get the patient on new tezepelumab.  But our pharmacy team has been unable to reach the patient.    Past Medical History:  Diagnosis Date   ADHD    Allergy    Anxiety    Asthma    Depression    Eczema    Environmental allergies    Headache      Family History  Problem Relation Age of Onset   Allergic rhinitis Mother    Asthma Mother    COPD Mother    Migraines Mother    Allergic rhinitis Father  Asthma Father    Schizophrenia Father    Asthma Sister    Asthma Brother    Migraines Brother      Past Surgical History:  Procedure Laterality Date   GANGLION CYST EXCISION Right 04/26/2021   Procedure: right dorsal carpal ganglion cyst excision;  Surgeon: Orene Desanctis, MD;  Location: [facility];  Service: Orthopedics;  Laterality: Right;  with MAC anesthesia needs 30 minutes   INCISION AND DRAINAGE WOUND WITH NAILBED REPAIR Left 04/26/2021   Procedure: INCISION AND DRAINAGE WOUND WITH NAILBED REPAIR LEFT MIDDLE FINGER;  Surgeon: Orene Desanctis, MD;  Location: [facility];  Service: Orthopedics;  Laterality:  Left;   UMBILICAL HERNIA REPAIR      Social History   Socioeconomic History   Marital status: Single    Spouse name: Not on file   Number of children: Not on file   Years of education: Not on file   Highest education level: Not on file  Occupational History   Not on file  Tobacco Use   Smoking status: Former    Packs/day: 0.25    Years: 4.00    Total pack years: 1.00    Types: Cigarettes, Cigars    Quit date: 11/06/2019    Years since quitting: 2.4   Smokeless tobacco: Never   Tobacco comments:    Patient smokes black n milds  Vaping Use   Vaping Use: Never used  Substance and Sexual Activity   Alcohol use: Not Currently    Comment: has a drink occasionally   Drug use: Yes    Types: Marijuana    Comment: rarely   Sexual activity: Yes    Birth control/protection: None    Comment: Pt states she is a lesbian; no protection used   Other Topics Concern   Not on file  Social History Narrative   Lives with mother, brother, sister. [place] in house. 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   She attends [employer]. She does ok in school.    She enjoys ROTC and basketball.   Social Determinants of Health   Financial Resource Strain: Low Risk  (04/17/2019)   Overall Financial Resource Strain (CARDIA)    Difficulty of Paying Living Expenses: Not hard at all  Food Insecurity: No Food Insecurity (04/17/2019)   Hunger Vital Sign    Worried About Running Out of Food in the Last Year: Never true    Ran Out of Food in the Last Year: Never true  Transportation Needs: No Transportation Needs (04/17/2019)   PRAPARE - Hydrologist (Medical): No    Lack of Transportation (Non-Medical): No  Physical Activity: Unknown (04/17/2019)   Exercise Vital Sign    Days of Exercise per Week: Patient refused    Minutes of Exercise per Session: Patient refused  Stress: No Stress Concern Present (04/17/2019)   [place]    Feeling of Stress : Not at all  Social Connections: Unknown (04/17/2019)   Social Connection and Isolation Panel [NHANES]    Frequency of Communication with Friends and Family: More than three times a week    Frequency of Social Gatherings with Friends and Family: More than three times a week    Attends Religious Services: Patient refused    Active Member of Clubs or Organizations: No    Attends Archivist Meetings: Never    Marital Status: Never married  Intimate Partner Violence: Not At Risk (  04/17/2019)   Humiliation, Afraid, Rape, and Kick questionnaire    Fear of Current or Ex-Partner: No    Emotionally Abused: No    Physically Abused: No    Sexually Abused: No     Allergies  Allergen Reactions   Bee Venom Shortness Of Breath   Eggs Or Egg-Derived Products Shortness Of Breath   Other Anaphylaxis and Other (See Comments)    Nuts and Tree nuts Pet dander cats and dogs   Peanut-Containing Drug Products Anaphylaxis   Pollen Extract Swelling     Outpatient Medications Prior to Visit  Medication Sig Dispense Refill   albuterol (VENTOLIN HFA) 108 (90 Base) MCG/ACT inhaler Inhale 2 puffs into the lungs every 6 (six) hours as needed for wheezing or shortness of breath. 8 g 2   Budeson-Glycopyrrol-Formoterol (BREZTRI AEROSPHERE) 160-9-4.8 MCG/ACT AERO Inhale 2 puffs into the lungs in the morning and at bedtime. 10.7 g 0   cetirizine (ZYRTEC) 10 MG tablet TAKE 1 TABLET BY MOUTH EVERY DAY 90 tablet 1   EPIPEN 2-PAK 0.3 MG/0.3ML SOAJ injection Inject 0.3 mg into the muscle as needed for anaphylaxis. 1 each 1   fluticasone (FLONASE) 50 MCG/ACT nasal spray 2 sprays per nostril daily for 1-2 weeks at a time before stopping once nasal congestion improves. 1 g 5   guanFACINE (INTUNIV) 1 MG TB24 ER tablet Take 1 tablet (1 mg total) by mouth at bedtime. 30 tablet 0   hydrOXYzine (ATARAX/VISTARIL) 25 MG tablet Take 1 tablet (25 mg total) by mouth 3 (three) times  daily as needed for anxiety. 30 tablet 1   ipratropium-albuterol (DUONEB) 0.5-2.5 (3) MG/3ML SOLN Take it 2 times scheduled and every 8 hours as needed if needed for shortness of breath and wheezing. 360 mL 0   mepolizumab (NUCALA) 100 MG injection Inject 100 mg into the skin every 28 (twenty-eight) days.     olopatadine (PATANOL) 0.1 % ophthalmic solution Place 1 drop into both eyes 2 (two) times daily as needed for allergies (Itchy, watery eyes). 5 mL 5   polyethylene glycol (MIRALAX / GLYCOLAX) 17 g packet Take 17 g by mouth daily. 14 each 0   VENTOLIN HFA 108 (90 Base) MCG/ACT inhaler INHALE 2 PUFFS BY MOUTH EVERY 4 HOURS AS NEEDED FOR WHEEZE OR FOR SHORTNESS OF BREATH 18 each 2   VYVANSE 60 MG capsule Take 1 capsule (60 mg total) by mouth every morning. 30 capsule 0   Facility-Administered Medications Prior to Visit  Medication Dose Route Frequency Provider Last Rate Last Admin   mepolizumab (NUCALA) injection 100 mg  100 mg Subcutaneous Q28 days Kennith Gain, MD   100 mg at 08/22/21 1445   tezepelumab-ekko (TEZSPIRE) 210 MG/1.91ML syringe 210 mg  210 mg Subcutaneous Q28 days Kennith Gain, MD   210 mg at 10/04/21 1149    ROS   Objective:   Physical Exam   There were no vitals filed for this visit.    on RA BMI Readings from Last 3 Encounters:  02/14/22 32.67 kg/m (96 %, Z= 1.76)*  11/28/21 32.66 kg/m (96 %, Z= 1.77)*  11/02/21 32.66 kg/m (96 %, Z= 1.77)*   * Growth percentiles are based on CDC (Girls, 2-20 Years) data.   Wt Readings from Last 3 Encounters:  02/14/22 178 lb 9.6 oz (81 kg) (95 %, Z= 1.63)*  11/28/21 178 lb 9.2 oz (81 kg) (95 %, Z= 1.64)*  11/02/21 178 lb 9.2 oz (81 kg) (95 %, Z= 1.64)*   *  Growth percentiles are based on CDC (Girls, 2-20 Years) data.     CBC    Component Value Date/Time   WBC 7.7 10/21/2021 2341   RBC 4.21 10/21/2021 2341   HGB 11.9 (L) 10/21/2021 2341   HGB 13.8 05/03/2021 1754   HCT 36.2 10/21/2021  2341   HCT 40.7 05/03/2021 1754   PLT 298 10/21/2021 2341   PLT 365 12/01/2015 1001   MCV 86.0 10/21/2021 2341   MCV 87 05/03/2021 1754   MCH 28.3 10/21/2021 2341   MCHC 32.9 10/21/2021 2341   RDW 12.3 10/21/2021 2341   RDW 13.2 05/03/2021 1754   LYMPHSABS 2.9 10/21/2021 2341   LYMPHSABS 1.6 05/03/2021 1754   MONOABS 0.5 10/21/2021 2341   EOSABS 0.1 10/21/2021 2341   EOSABS 0.1 05/03/2021 1754   BASOSABS 0.0 10/21/2021 2341   BASOSABS 0.0 05/03/2021 1754    Chest Imaging: Most recent chest x-ray in February 2023 showed no acute process.  Pulmonary Functions Testing Results:     No data to display           Echocardiogram: June 2018 -Levocardia. Normally related great arteries.  -Normal return of IVC to RA. SVC not well seen. Pulmonary veins not well seen.  -Normal right atrial size. Normal left atrial size.  -Atrial septum not well visualized.  -Normal tricuspid valve. Mild tricuspid regurgitation.  -Normal mitral valve. Normal mitral inflow. Trivial mitral regurgitation.  -Normal right ventricular size and systolic function.  -Normal left ventricular size and systolic function.  -No ventricular septal defect detected.  -Normal pulmonary valve. No pulmonary stenosis. Trivial pulmonary insufficiency.  -No aortic stenosis or insufficiency.  -Normal pulmonary arteries.  -No evidence of a patent ductus arteriosus.  -Normal appearing origin of the left coronary artery by 2D imaging, not confirmed by color Doppler imaging. Right coronary artery not well seen.  -Non-obstructive flow pattern in the aortic arch.  -No pericardial effusion.      Assessment & Plan:   No diagnosis found.   Discussion: Jacqueline [name] presents today for severe persistent asthma. It seems that running out of her medications and non-compliance with scheduled follow-ups is contributing most to her symptoms and recurrent flares. When a flare starts she has overwhelming anxiety which further  exacerbates her symptoms. She reports financial constraints for obtaining her medications which may explain appointment non-compliance as well, however I think that her age and lack of understanding of the severity of her disease is contributing most.. She is no longer smoking and has been counseled on how dangerous smoking with her disease is. -Patient counseled on seasonal flu vaccine -Home with Breztri samples today -She will fill out financial assistance forms for [employer] and tezepelumab   Current Outpatient Medications:    albuterol (VENTOLIN HFA) 108 (90 Base) MCG/ACT inhaler, Inhale 2 puffs into the lungs every 6 (six) hours as needed for wheezing or shortness of breath., Disp: 8 g, Rfl: 2   Budeson-Glycopyrrol-Formoterol (BREZTRI AEROSPHERE) 160-9-4.8 MCG/ACT AERO, Inhale 2 puffs into the lungs in the morning and at bedtime., Disp: 10.7 g, Rfl: 0   cetirizine (ZYRTEC) 10 MG tablet, TAKE 1 TABLET BY MOUTH EVERY DAY, Disp: 90 tablet, Rfl: 1   EPIPEN 2-PAK 0.3 MG/0.3ML SOAJ injection, Inject 0.3 mg into the muscle as needed for anaphylaxis., Disp: 1 each, Rfl: 1   fluticasone (FLONASE) 50 MCG/ACT nasal spray, 2 sprays per nostril daily for 1-2 weeks at a time before stopping once nasal congestion improves., Disp: 1 g, Rfl: 5  guanFACINE (INTUNIV) 1 MG TB24 ER tablet, Take 1 tablet (1 mg total) by mouth at bedtime., Disp: 30 tablet, Rfl: 0   hydrOXYzine (ATARAX/VISTARIL) 25 MG tablet, Take 1 tablet (25 mg total) by mouth 3 (three) times daily as needed for anxiety., Disp: 30 tablet, Rfl: 1   ipratropium-albuterol (DUONEB) 0.5-2.5 (3) MG/3ML SOLN, Take it 2 times scheduled and every 8 hours as needed if needed for shortness of breath and wheezing., Disp: 360 mL, Rfl: 0   mepolizumab (NUCALA) 100 MG injection, Inject 100 mg into the skin every 28 (twenty-eight) days., Disp: , Rfl:    olopatadine (PATANOL) 0.1 % ophthalmic solution, Place 1 drop into both eyes 2 (two) times daily as needed for  allergies (Itchy, watery eyes)., Disp: 5 mL, Rfl: 5   polyethylene glycol (MIRALAX / GLYCOLAX) 17 g packet, Take 17 g by mouth daily., Disp: 14 each, Rfl: 0   VENTOLIN HFA 108 (90 Base) MCG/ACT inhaler, INHALE 2 PUFFS BY MOUTH EVERY 4 HOURS AS NEEDED FOR WHEEZE OR FOR SHORTNESS OF BREATH, Disp: 18 each, Rfl: 2   VYVANSE 60 MG capsule, Take 1 capsule (60 mg total) by mouth every morning., Disp: 30 capsule, Rfl: 0  Current Facility-Administered Medications:    mepolizumab (NUCALA) injection 100 mg, 100 mg, Subcutaneous, Q28 days, Kennith Gain, MD, 100 mg at 08/22/21 1445   tezepelumab-ekko (TEZSPIRE) 210 MG/1.91ML syringe 210 mg, 210 mg, Subcutaneous, Q28 days, Kennith Gain, MD, 210 mg at 10/04/21 [place], DO [facility] 04/01/2022 8:59 AM

## 2022-04-01 NOTE — Progress Notes (Deleted)
HPI Patient presents today to [facility] to see pharmacy team for [facility] new start.  Past medical history includes   Number of hospitalizations in past year: 2 Number of asthma exacerbations in past year:  7  Respiratory Medications Current regimen: albuterol 61mcg/act 2 puffs q6h prn, Breztri 160-9-4.8 mcg/act 2 puffs BID, cetirizine 10mg  once daily, Duoneb 0.5-2.5mg /32mL 1 nebule BID scheduled and one nebule TID prn Tried in past: *** Patient reports {Adherence challenges yes no:[id number]::"adherence challenges","no known adherence challenges"}  OBJECTIVE Allergies  Allergen Reactions   Bee Venom Shortness Of Breath   Eggs Or Egg-Derived Products Shortness Of Breath   Other Anaphylaxis and Other (See Comments)    Nuts and Tree nuts Pet dander cats and dogs   Peanut-Containing Drug Products Anaphylaxis   Pollen Extract Swelling    Outpatient Encounter Medications as of 04/03/2022  Medication Sig Note   albuterol (VENTOLIN HFA) 108 (90 Base) MCG/ACT inhaler Inhale 2 puffs into the lungs every 6 (six) hours as needed for wheezing or shortness of breath.    Budeson-Glycopyrrol-Formoterol (BREZTRI AEROSPHERE) 160-9-4.8 MCG/ACT AERO Inhale 2 puffs into the lungs in the morning and at bedtime.    cetirizine (ZYRTEC) 10 MG tablet TAKE 1 TABLET BY MOUTH EVERY DAY    EPIPEN 2-PAK 0.3 MG/0.3ML SOAJ injection Inject 0.3 mg into the muscle as needed for anaphylaxis.    fluticasone (FLONASE) 50 MCG/ACT nasal spray 2 sprays per nostril daily for 1-2 weeks at a time before stopping once nasal congestion improves.    guanFACINE (INTUNIV) 1 MG TB24 ER tablet Take 1 tablet (1 mg total) by mouth at bedtime.    hydrOXYzine (ATARAX/VISTARIL) 25 MG tablet Take 1 tablet (25 mg total) by mouth 3 (three) times daily as needed for anxiety.    ipratropium-albuterol (DUONEB) 0.5-2.5 (3) MG/3ML SOLN Take it 2 times scheduled and every 8 hours as needed if needed for shortness of breath and wheezing.     mepolizumab (NUCALA) 100 MG injection Inject 100 mg into the skin every 28 (twenty-eight) days.    olopatadine (PATANOL) 0.1 % ophthalmic solution Place 1 drop into both eyes 2 (two) times daily as needed for allergies (Itchy, watery eyes).    polyethylene glycol (MIRALAX / GLYCOLAX) 17 g packet Take 17 g by mouth daily.    VENTOLIN HFA 108 (90 Base) MCG/ACT inhaler INHALE 2 PUFFS BY MOUTH EVERY 4 HOURS AS NEEDED FOR WHEEZE OR FOR SHORTNESS OF BREATH    VYVANSE 60 MG capsule Take 1 capsule (60 mg total) by mouth every morning. 07/25/2021: Need refills   Facility-Administered Encounter Medications as of 04/03/2022  Medication   mepolizumab (NUCALA) injection 100 mg   tezepelumab-ekko (TEZSPIRE) 210 MG/1.04/05/2022 syringe 210 mg     Immunization History  Administered Date(s) Administered   Influenza-Unspecified 04/21/2021     PFTs     No data to display           Eosinophils Most recent blood eosinophil count was *** cells/microL taken on ***.   IgE: *** on ***   Assessment   Biologics training for tezepulumab 06/21/2021)  Goals of therapy: Mechanism: human monoclonal IgG2? 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 This blocks TSLP from its effect on inflammation including reduce eosinophils, IgE, FeNO, IL-5, and IL-13. Mechanism is not definitively established. Reviewed that Dorothea Ogle is add-on medication and patient must continue maintenance inhaler regimen. Response to therapy: may take 3-4 months to determine efficacy.  Side effects: injection site reaction (6-18%), antibody development (2%), arthralgia (  4%), back pain (4%), pharyngitis (4%)  Dose: Tezspire 210 mg once every 4 weeks  Administration/Storage:  Reviewed administration sites of thigh or abdomen (at least 2-3 inches away from abdomen). Reviewed the upper arm is only appropriate if caregiver is administering injection  Do not shake pen/syringe as this could lead to product foaming or precipitation. Do not shake syringe as this  could lead to product foaming or precipitation.  Access: Approval of Tezspire through: {specialtycoverage:25706} Patient enrolled into copay card program to help with copay assistance.  Patient self-administered Tezspire 210mg /1.91 ml in *** using sample Tezspire 210mg /1.91 ml Autoinjector pen NDC: *** Lot: *** Expiration: ***  Patient monitored for 30 minutes for adverse reaction.  Patient tolerated ***. Injection site checked and no ***.  Medication Reconciliation  A drug regimen assessment was performed, including review of allergies, interactions, disease-state management, dosing and immunization history. Medications were reviewed with the patient, including name, instructions, indication, goals of therapy, potential side effects, importance of adherence, and safe use.  Drug interaction(s): ***  Immunizations  Patient is indicated for the influenzae, pneumonia, and shingles vaccinations. Patient has received *** COVID19 vaccines.   PLAN Continue Tezspire 210mg  SQ every 28 days.  Rx sent to: {SpecialtyPharmacies:25705}.   Continue maintenance asthma regimen of: ***  All questions encouraged and answered.  Instructed patient to reach out with any further questions or concerns.  Thank you for allowing pharmacy to participate in this patient's care.  This appointment required *** minutes of patient care (this includes precharting, chart review, review of results, face-to-face care, etc.).  , PharmD, MPH, BCPS, CPP Clinical Pharmacist (Rheumatology and Pulmonology)

## 2022-04-03 ENCOUNTER — Other Ambulatory Visit: Payer: Federal, State, Local not specified - PPO | Admitting: Pharmacist

## 2022-04-03 NOTE — Telephone Encounter (Signed)
Second no-show to [facility] new start visit today. Patient states she called clinic to r/s but phone rang and hung up. R/s for 04/04/22  Chesley Mires, PharmD, MPH, BCPS, CPP Clinical Pharmacist (Rheumatology and Pulmonology)

## 2022-04-04 ENCOUNTER — Other Ambulatory Visit: Payer: Federal, State, Local not specified - PPO | Admitting: Pharmacist

## 2022-04-04 NOTE — Telephone Encounter (Signed)
Patient was third no-show to [facility] new start appt today. She has specifically said that she would be free today from work and her mother would be able to join. Did not receive call requesting rescheduling for any of the three appointments. Pharmacy team will not be reaching out to r/s at this time. Letter sent to patient. If she is committed to starting in future, we are happy to re-run benefits for her  Chesley Mires, PharmD, MPH, BCPS, CPP Clinical Pharmacist (Rheumatology and Pulmonology)

## 2022-04-04 NOTE — Progress Notes (Deleted)
HPI Patient presents today to [facility] to see pharmacy team for [facility] new start.  Past medical history includes ***  Number of hospitalizations in past year: Number of ***COPD/asthma exacerbations in past year:   Respiratory Medications Current regimen: *** Tried in past: *** Patient reports {Adherence challenges yes no:3044014::"adherence challenges","no known adherence challenges"}  OBJECTIVE Allergies  Allergen Reactions   Bee Venom Shortness Of Breath   Eggs Or Egg-Derived Products Shortness Of Breath   Other Anaphylaxis and Other (See Comments)    Nuts and Tree nuts Pet dander cats and dogs   Peanut-Containing Drug Products Anaphylaxis   Pollen Extract Swelling    Outpatient Encounter Medications as of 04/04/2022  Medication Sig Note   albuterol (VENTOLIN HFA) 108 (90 Base) MCG/ACT inhaler Inhale 2 puffs into the lungs every 6 (six) hours as needed for wheezing or shortness of breath.    Budeson-Glycopyrrol-Formoterol (BREZTRI AEROSPHERE) 160-9-4.8 MCG/ACT AERO Inhale 2 puffs into the lungs in the morning and at bedtime.    cetirizine (ZYRTEC) 10 MG tablet TAKE 1 TABLET BY MOUTH EVERY DAY    EPIPEN 2-PAK 0.3 MG/0.3ML SOAJ injection Inject 0.3 mg into the muscle as needed for anaphylaxis.    fluticasone (FLONASE) 50 MCG/ACT nasal spray 2 sprays per nostril daily for 1-2 weeks at a time before stopping once nasal congestion improves.    guanFACINE (INTUNIV) 1 MG TB24 ER tablet Take 1 tablet (1 mg total) by mouth at bedtime.    hydrOXYzine (ATARAX/VISTARIL) 25 MG tablet Take 1 tablet (25 mg total) by mouth 3 (three) times daily as needed for anxiety.    ipratropium-albuterol (DUONEB) 0.5-2.5 (3) MG/3ML SOLN Take it 2 times scheduled and every 8 hours as needed if needed for shortness of breath and wheezing.    mepolizumab (NUCALA) 100 MG injection Inject 100 mg into the skin every 28 (twenty-eight) days.    olopatadine (PATANOL) 0.1 % ophthalmic solution Place 1  drop into both eyes 2 (two) times daily as needed for allergies (Itchy, watery eyes).    polyethylene glycol (MIRALAX / GLYCOLAX) 17 g packet Take 17 g by mouth daily.    VENTOLIN HFA 108 (90 Base) MCG/ACT inhaler INHALE 2 PUFFS BY MOUTH EVERY 4 HOURS AS NEEDED FOR WHEEZE OR FOR SHORTNESS OF BREATH    VYVANSE 60 MG capsule Take 1 capsule (60 mg total) by mouth every morning. 07/25/2021: Need refills   Facility-Administered Encounter Medications as of 04/04/2022  Medication   mepolizumab (NUCALA) injection 100 mg   tezepelumab-ekko (TEZSPIRE) 210 MG/1. syringe 210 mg     Immunization History  Administered Date(s) Administered   Influenza-Unspecified 04/21/2021     PFTs     No data to display           Eosinophils Most recent blood eosinophil count was *** cells/microL taken on ***.   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 This blocks TSLP from its effect on inflammation including reduce eosinophils, IgE, FeNO, IL-5, and IL-13. Mechanism is not definitively established. Reviewed that Dorothea Ogle is add-on medication and patient must continue maintenance inhaler regimen. Response to therapy: may take 3-4 months to determine efficacy.  Side effects: injection site reaction (6-18%), antibody development (2%), arthralgia (4%), back pain (4%), pharyngitis (4%)  Dose: Tezspire 210 mg once every 4 weeks  Administration/Storage:  Reviewed administration sites of thigh or abdomen (at least 2-3  inches away from abdomen). Reviewed the upper arm is only appropriate if caregiver is administering injection  Do not shake pen/syringe as this could lead to product foaming or precipitation. Do not shake syringe as this could lead to product foaming or precipitation.  Access: Approval of Tezspire through: {specialtycoverage:25706} Patient enrolled into copay card program  to help with copay assistance.  Patient self-administered Tezspire 210mg /1.91 ml in {injsitedsg:28167} using sample Tezspire 210mg /1.91 ml Autoinjector pen NDC: *** Lot: *** Expiration: ***  Patient monitored for 30 minutes for adverse reaction.  Patient tolerated ***. Injection site checked and no ***.  Medication Reconciliation  A drug regimen assessment was performed, including review of allergies, interactions, disease-state management, dosing and immunization history. Medications were reviewed with the patient, including name, instructions, indication, goals of therapy, potential side effects, importance of adherence, and safe use.  Drug interaction(s): ***  Immunizations  Patient is indicated for the influenzae, pneumonia, and shingles vaccinations. Patient has received *** COVID19 vaccines.   PLAN Continue Tezspire 210mg  SQ every 28 days.  Rx sent to: {SpecialtyPharmacies:25705}.   Continue maintenance asthma regimen of: ***  All questions encouraged and answered.  Instructed patient to reach out with any further questions or concerns.  Thank you for allowing pharmacy to participate in this patient's care.  This appointment required *** minutes of patient care (this includes precharting, chart review, review of results, face-to-face care, etc.).

## 2022-04-12 NOTE — Progress Notes (Deleted)
HPI Patient presents today to [facility] to see pharmacy team for [facility] new start.  Past medical history includes ***  Number of hospitalizations in past year: Number of ***COPD/asthma exacerbations in past year:   Respiratory Medications Current regimen: *** Tried in past: *** Patient reports {Adherence challenges yes no:3044014::"adherence challenges","no known adherence challenges"}  OBJECTIVE Allergies  Allergen Reactions   Bee Venom Shortness Of Breath   Eggs Or Egg-Derived Products Shortness Of Breath   Other Anaphylaxis and Other (See Comments)    Nuts and Tree nuts Pet dander cats and dogs   Peanut-Containing Drug Products Anaphylaxis   Pollen Extract Swelling    Outpatient Encounter Medications as of 04/15/2022  Medication Sig Note   albuterol (VENTOLIN HFA) 108 (90 Base) MCG/ACT inhaler Inhale 2 puffs into the lungs every 6 (six) hours as needed for wheezing or shortness of breath.    Budeson-Glycopyrrol-Formoterol (BREZTRI AEROSPHERE) 160-9-4.8 MCG/ACT AERO Inhale 2 puffs into the lungs in the morning and at bedtime.    cetirizine (ZYRTEC) 10 MG tablet TAKE 1 TABLET BY MOUTH EVERY DAY    EPIPEN 2-PAK 0.3 MG/0.3ML SOAJ injection Inject 0.3 mg into the muscle as needed for anaphylaxis.    fluticasone (FLONASE) 50 MCG/ACT nasal spray 2 sprays per nostril daily for 1-2 weeks at a time before stopping once nasal congestion improves.    guanFACINE (INTUNIV) 1 MG TB24 ER tablet Take 1 tablet (1 mg total) by mouth at bedtime.    hydrOXYzine (ATARAX/VISTARIL) 25 MG tablet Take 1 tablet (25 mg total) by mouth 3 (three) times daily as needed for anxiety.    ipratropium-albuterol (DUONEB) 0.5-2.5 (3) MG/3ML SOLN Take it 2 times scheduled and every 8 hours as needed if needed for shortness of breath and wheezing.    mepolizumab (NUCALA) 100 MG injection Inject 100 mg into the skin every 28 (twenty-eight) days.    olopatadine (PATANOL) 0.1 % ophthalmic solution Place 1  drop into both eyes 2 (two) times daily as needed for allergies (Itchy, watery eyes).    polyethylene glycol (MIRALAX / GLYCOLAX) 17 g packet Take 17 g by mouth daily.    VENTOLIN HFA 108 (90 Base) MCG/ACT inhaler INHALE 2 PUFFS BY MOUTH EVERY 4 HOURS AS NEEDED FOR WHEEZE OR FOR SHORTNESS OF BREATH    VYVANSE 60 MG capsule Take 1 capsule (60 mg total) by mouth every morning. 07/25/2021: Need refills   Facility-Administered Encounter Medications as of 04/15/2022  Medication   mepolizumab (NUCALA) injection 100 mg   tezepelumab-ekko (TEZSPIRE) 210 MG/1. syringe 210 mg     Immunization History  Administered Date(s) Administered   Influenza-Unspecified 04/21/2021     PFTs     No data to display           Eosinophils Most recent blood eosinophil count was *** cells/microL taken on ***.   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 This blocks TSLP from its effect on inflammation including reduce eosinophils, IgE, FeNO, IL-5, and IL-13. Mechanism is not definitively established. Reviewed that Dorothea Ogle is add-on medication and patient must continue maintenance inhaler regimen. Response to therapy: may take 3-4 months to determine efficacy.  Side effects: injection site reaction (6-18%), antibody development (2%), arthralgia (4%), back pain (4%), pharyngitis (4%)  Dose: Tezspire 210 mg once every 4 weeks  Administration/Storage:  Reviewed administration sites of thigh or abdomen (at least 2-3  inches away from abdomen). Reviewed the upper arm is only appropriate if caregiver is administering injection  Do not shake pen/syringe as this could lead to product foaming or precipitation. Do not shake syringe as this could lead to product foaming or precipitation.  Access: Approval of Tezspire through: {specialtycoverage:25706} Patient enrolled into copay card program  to help with copay assistance.  Patient self-administered Tezspire 210mg /1.91 ml in {injsitedsg:28167} using sample Tezspire 210mg /1.91 ml Autoinjector pen NDC: *** Lot: *** Expiration: ***  Patient monitored for 30 minutes for adverse reaction.  Patient tolerated ***. Injection site checked and no ***.  Medication Reconciliation  A drug regimen assessment was performed, including review of allergies, interactions, disease-state management, dosing and immunization history. Medications were reviewed with the patient, including name, instructions, indication, goals of therapy, potential side effects, importance of adherence, and safe use.  Drug interaction(s): ***  Immunizations  Patient is indicated for the influenzae, pneumonia, and shingles vaccinations. Patient has received *** COVID19 vaccines.   PLAN Continue Tezspire 210mg  SQ every 28 days.  Rx sent to: {SpecialtyPharmacies:25705}.   Continue maintenance asthma regimen of: ***  All questions encouraged and answered.  Instructed patient to reach out with any further questions or concerns.  Thank you for allowing pharmacy to participate in this patient's care.  This appointment required *** minutes of patient care (this includes precharting, chart review, review of results, face-to-face care, etc.).  Knox Saliva, PharmD, MPH, BCPS, CPP Clinical Pharmacist (Rheumatology and Pulmonology)

## 2022-04-15 ENCOUNTER — Other Ambulatory Visit: Payer: Federal, State, Local not specified - PPO | Admitting: Pharmacist

## 2022-05-21 ENCOUNTER — Encounter (HOSPITAL_BASED_OUTPATIENT_CLINIC_OR_DEPARTMENT_OTHER): Payer: Self-pay

## 2022-05-21 ENCOUNTER — Emergency Department (HOSPITAL_BASED_OUTPATIENT_CLINIC_OR_DEPARTMENT_OTHER): Payer: Federal, State, Local not specified - PPO | Admitting: Radiology

## 2022-05-21 ENCOUNTER — Emergency Department (HOSPITAL_BASED_OUTPATIENT_CLINIC_OR_DEPARTMENT_OTHER)
Admission: EM | Admit: 2022-05-21 | Discharge: 2022-05-21 | Disposition: A | Discharge status: Home / Self Care | Payer: Federal, State, Local not specified - PPO | Attending: Emergency Medicine | Admitting: Emergency Medicine

## 2022-05-21 ENCOUNTER — Other Ambulatory Visit: Payer: Self-pay

## 2022-05-21 DIAGNOSIS — Z91010 Allergy to peanuts: Secondary | ICD-10-CM | POA: Diagnosis not present

## 2022-05-21 DIAGNOSIS — J4551 Severe persistent asthma with (acute) exacerbation: Secondary | ICD-10-CM | POA: Diagnosis not present

## 2022-05-21 DIAGNOSIS — J020 Streptococcal pharyngitis: Secondary | ICD-10-CM | POA: Diagnosis not present

## 2022-05-21 DIAGNOSIS — Z7952 Long term (current) use of systemic steroids: Secondary | ICD-10-CM | POA: Diagnosis not present

## 2022-05-21 DIAGNOSIS — N898 Other specified noninflammatory disorders of vagina: Secondary | ICD-10-CM | POA: Insufficient documentation

## 2022-05-21 DIAGNOSIS — Z7951 Long term (current) use of inhaled steroids: Secondary | ICD-10-CM | POA: Insufficient documentation

## 2022-05-21 DIAGNOSIS — J029 Acute pharyngitis, unspecified: Secondary | ICD-10-CM | POA: Diagnosis present

## 2022-05-21 LAB — PREGNANCY, URINE: Preg Test, Ur: NEGATIVE

## 2022-05-21 LAB — GROUP A STREP BY PCR: Group A Strep by PCR: DETECTED — AB

## 2022-05-21 LAB — URINALYSIS, ROUTINE W REFLEX MICROSCOPIC
Bilirubin Urine: NEGATIVE
Glucose, UA: NEGATIVE mg/dL
Hgb urine dipstick: NEGATIVE
Ketones, ur: NEGATIVE mg/dL
Leukocytes,Ua: NEGATIVE
Nitrite: NEGATIVE
Protein, ur: NEGATIVE mg/dL
Specific Gravity, Urine: 1.022 (ref 1.005–1.030)
pH: 6 (ref 5.0–8.0)

## 2022-05-21 LAB — WET PREP, GENITAL
Clue Cells Wet Prep HPF POC: NONE SEEN
Sperm: NONE SEEN
Trich, Wet Prep: NONE SEEN
WBC, Wet Prep HPF POC: <10 (ref <10)
Yeast Wet Prep HPF POC: NONE SEEN

## 2022-05-21 MED ORDER — ALBUTEROL SULFATE HFA 108 (90 BASE) MCG/ACT IN AERS: 2.0000 | INHALATION_SPRAY | RESPIRATORY_TRACT | Status: DC | PRN

## 2022-05-21 MED ORDER — PENICILLIN G BENZATHINE 1200000 UNIT/2ML IM SUSY
1.2000 10*6.[IU] | PREFILLED_SYRINGE | Freq: Once | INTRAMUSCULAR | Status: AC
  Administered 2022-05-21: 1.2 10*6.[IU] via INTRAMUSCULAR
  Filled 2022-05-21: qty 2

## 2022-05-21 MED ORDER — PREDNISONE 20 MG PO TABS: 60.0000 mg | ORAL_TABLET | Freq: Every day | ORAL | 0 refills | Status: DC

## 2022-05-21 MED ORDER — IPRATROPIUM-ALBUTEROL 0.5-2.5 (3) MG/3ML IN SOLN
3.0000 mL | Freq: Once | RESPIRATORY_TRACT | Status: AC
  Administered 2022-05-21: 3 mL via RESPIRATORY_TRACT
  Filled 2022-05-21: qty 3

## 2022-05-21 MED ORDER — IPRATROPIUM-ALBUTEROL 0.5-2.5 (3) MG/3ML IN SOLN
3.0000 mL | Freq: Once | RESPIRATORY_TRACT | Status: AC
Start: 2022-05-21 — End: 2022-05-21
  Administered 2022-05-21: 3 mL via RESPIRATORY_TRACT
  Filled 2022-05-21: qty 3

## 2022-05-21 MED ORDER — ALBUTEROL SULFATE HFA 108 (90 BASE) MCG/ACT IN AERS
2.0000 | INHALATION_SPRAY | RESPIRATORY_TRACT | Status: DC | PRN
  Administered 2022-05-21: 2 via RESPIRATORY_TRACT
  Filled 2022-05-21: qty 6.7

## 2022-05-21 NOTE — ED Provider Notes (Signed)
[facility] EMERGENCY DEPT  Provider Note  CSN: [id number] Arrival date & time: 05/21/22 0228  History Chief Complaint  Patient presents with   Shortness of Breath    Jacqueline [name] is a 21 y.o. female with history of severe asthma, managed by Pulm, poor compliance with meds reports sore throat and post-nasal drop for a few days, wheezing began earlier in the day yesterday. Progressed at home, not improved with her inhalers. She also reports vaginal discharge and is requesting STI check. She has had unprotected, albeit same-sex intercourse recently.    Home Medications Prior to Admission medications   Medication Sig Start Date End Date Taking? Authorizing Provider  predniSONE (DELTASONE) 20 MG tablet Take 3 tablets (60 mg total) by mouth daily. 05/21/22  Yes Pollyann Savoy, MD  albuterol (VENTOLIN HFA) 108 (90 Base) MCG/ACT inhaler Inhale 2 puffs into the lungs every 6 (six) hours as needed for wheezing or shortness of breath. 02/09/22   Elson Areas, PA-C  Budeson-Glycopyrrol-Formoterol (BREZTRI AEROSPHERE) 160-9-4.8 MCG/ACT AERO Inhale 2 puffs into the lungs in the morning and at bedtime. 02/14/22   Icard, Rachel Bo, DO  cetirizine (ZYRTEC) 10 MG tablet TAKE 1 TABLET BY MOUTH EVERY DAY 10/22/21   Padgett, Pilar Grammes, MD  EPIPEN 2-PAK 0.3 MG/0.3ML SOAJ injection Inject 0.3 mg into the muscle as needed for anaphylaxis. 10/04/21   Marcelyn Bruins, MD  fluticasone (FLONASE) 50 MCG/ACT nasal spray 2 sprays per nostril daily for 1-2 weeks at a time before stopping once nasal congestion improves. 10/04/21   Marcelyn Bruins, MD  guanFACINE (INTUNIV) 1 MG TB24 ER tablet Take 1 tablet (1 mg total) by mouth at bedtime. 05/21/21   Karsten Ro, MD  hydrOXYzine (ATARAX/VISTARIL) 25 MG tablet Take 1 tablet (25 mg total) by mouth 3 (three) times daily as needed for anxiety. 09/01/18   Verneda Skill, FNP  ipratropium-albuterol (DUONEB) 0.5-2.5 (3)  MG/3ML SOLN Take it 2 times scheduled and every 8 hours as needed if needed for shortness of breath and wheezing. 09/16/21   Leroy Sea, MD  mepolizumab (NUCALA) 100 MG injection Inject 100 mg into the skin every 28 (twenty-eight) days.    [provider]  olopatadine (PATANOL) 0.1 % ophthalmic solution Place 1 drop into both eyes 2 (two) times daily as needed for allergies (Itchy, watery eyes). 10/04/21   Marcelyn Bruins, MD  polyethylene glycol (MIRALAX / GLYCOLAX) 17 g packet Take 17 g by mouth daily. 10/22/21   Horton, Mayer Masker, MD  VENTOLIN HFA 108 (90 Base) MCG/ACT inhaler INHALE 2 PUFFS BY MOUTH EVERY 4 HOURS AS NEEDED FOR WHEEZE OR FOR SHORTNESS OF BREATH 02/11/22   Padgett, Pilar Grammes, MD  VYVANSE 60 MG capsule Take 1 capsule (60 mg total) by mouth every morning. 05/21/21   Karsten Ro, MD     Allergies    Bee venom, Eggs or egg-derived products, Other, Peanut-containing drug products, and Pollen extract   Review of Systems   Review of Systems Please see HPI for pertinent positives and negatives  Physical Exam BP 105/71   Pulse 94   Temp 98.1 F (36.7 C) (Oral)   Resp 18   Ht 5\' 2"  (1.575 m)   Wt 81.6 kg   SpO2 96%   BMI 32.92 kg/m   Physical Exam Vitals and nursing note reviewed.  Constitutional:      Appearance: Normal appearance.  HENT:     Head: Normocephalic and atraumatic.  Nose: Nose normal.     Mouth/Throat:     Mouth: Mucous membranes are moist.     Comments: Erythema, no exudate Eyes:     Extraocular Movements: Extraocular movements intact.     Conjunctiva/sclera: Conjunctivae normal.  Cardiovascular:     Rate and Rhythm: Normal rate.  Pulmonary:     Effort: Pulmonary effort is normal.     Breath sounds: Wheezing present.  Abdominal:     General: Abdomen is flat.     Palpations: Abdomen is soft.     Tenderness: There is no abdominal tenderness.  Musculoskeletal:        General: No swelling. Normal range of  motion.     Cervical back: Neck supple.  Skin:    General: Skin is warm and dry.  Neurological:     General: No focal deficit present.     Mental Status: She is alert.  Psychiatric:        Mood and Affect: Mood normal.     ED Results / Procedures / Treatments   EKG EKG Interpretation  Date/Time:  Tuesday May 21 2022 02:41:41 EDT Ventricular Rate:  111 PR Interval:  131 QRS Duration: 94 QT Interval:  343 QTC Calculation: 467 R Axis:   38 Text Interpretation: Sinus tachycardia Multiple premature complexes, vent & supraven Since last tracing PVCs now present Confirmed by Calvert Cantor [id number]) on 05/21/2022 3:06:42 AM  Procedures Procedures  Medications Ordered in the ED Medications  albuterol (VENTOLIN HFA) 108 (90 Base) MCG/ACT inhaler 2 puff (has no administration in time range)  ipratropium-albuterol (DUONEB) 0.5-2.5 (3) MG/3ML nebulizer solution 3 mL (3 mLs Nebulization Given 05/21/22 0255)  penicillin g benzathine (BICILLIN LA) 1200000 UNIT/2ML injection 1.2 Million Units (1.2 Million Units Intramuscular Given 05/21/22 0409)  ipratropium-albuterol (DUONEB) 0.5-2.5 (3) MG/3ML nebulizer solution 3 mL (3 mLs Nebulization Given 05/21/22 0422)    Initial Impression and Plan  Patient here for asthma, arrives by EMS who report they gave her duoneb and solumedrol prior to arrival, not much improvement. Also reports vaginal discharge and sore throat. Will check swabs of both. Continue with nebs.   ED Course   Clinical Course as of 05/21/22 0450  Tue May 21, 2022  0320 I personally viewed the images from radiology studies and agree with radiologist interpretation:  CXR is clear.  [CS]  0093 Strep is positive. Will treat with PCN.  [CS]  8182 UA is normal.  [CS]  0359 Wet prep is clear. HCG is neg.  [CS]  0448 Patient's breathing is improved. Plan discharge with Rx for oral prednisone for asthma exacerbation. Advised she is contagious with strep throat. PCP follow up.   [CS]    Clinical Course User Index [CS] Truddie Hidden, MD     MDM Rules/Calculators/A&P Medical Decision Making Given presenting complaint, I considered that admission might be necessary. After review of results from ED lab and/or imaging studies, admission to the hospital is not indicated at this time.    Problems Addressed: Severe persistent asthma with acute exacerbation: chronic illness or injury with exacerbation, progression, or side effects of treatment Strep pharyngitis: acute illness or injury  Amount and/or Complexity of Data Reviewed Labs: ordered. Decision-making details documented in ED Course. Radiology: ordered and independent interpretation performed. Decision-making details documented in ED Course. ECG/medicine tests: ordered and independent interpretation performed. Decision-making details documented in ED Course.  Risk Prescription drug management. Decision regarding hospitalization.    Final Clinical Impression(s) / ED Diagnoses Final diagnoses:  Severe persistent asthma with acute exacerbation  Strep pharyngitis    Rx / DC Orders ED Discharge Orders          Ordered    predniSONE (DELTASONE) 20 MG tablet  Daily        05/21/22 0450             Pollyann Savoy, MD 05/21/22 [phone]

## 2022-05-21 NOTE — ED Notes (Signed)
Patient received 5mg  Albuterol and 0.5 Atrovent en route per EMS

## 2022-05-21 NOTE — ED Triage Notes (Signed)
Pt BIB [facility] from home for asthma exacerbation with expiratory wheezes since this morning. Got 125 mg solumedrol and 1 duoneb with EMS.

## 2022-05-21 NOTE — ED Notes (Signed)
Pt verbalizes understanding of discharge instructions. Opportunity for questioning and answers were provided. Pt discharged from ED to home with mother.   ? ?

## 2022-05-22 LAB — GC/CHLAMYDIA PROBE AMP (~~LOC~~) NOT AT ARMC
Chlamydia: NEGATIVE
Comment: NEGATIVE
Comment: NORMAL
Neisseria Gonorrhea: NEGATIVE

## 2022-07-13 ENCOUNTER — Ambulatory Visit (HOSPITAL_COMMUNITY): Payer: Federal, State, Local not specified - PPO

## 2022-07-16 ENCOUNTER — Ambulatory Visit (HOSPITAL_COMMUNITY): Payer: Federal, State, Local not specified - PPO

## 2022-07-17 ENCOUNTER — Ambulatory Visit (HOSPITAL_COMMUNITY): Payer: Federal, State, Local not specified - PPO

## 2022-08-13 ENCOUNTER — Other Ambulatory Visit: Payer: Self-pay | Admitting: Allergy

## 2022-08-13 ENCOUNTER — Telehealth: Payer: Self-pay | Admitting: Pulmonary Disease

## 2022-08-13 NOTE — Telephone Encounter (Signed)
PT's mom calling for an inhaler refill. Has been seen in the last year. Stated this because Pharm sent a text to call the Dr for refill.   Pls call to advise.   [phone]     * albuterol 108 (90 Base) MCG/ACT inhaler Commonly known as: VENTOLIN HFA   Pharm: [facility] on [address]  Call came in after 5:00. Upset no call back today. Reminded her of the time.

## 2022-08-14 ENCOUNTER — Other Ambulatory Visit: Payer: Self-pay

## 2022-08-14 MED ORDER — ALBUTEROL SULFATE HFA 108 (90 BASE) MCG/ACT IN AERS: 2.0000 | INHALATION_SPRAY | Freq: Four times a day (QID) | RESPIRATORY_TRACT | 3 refills | Status: DC | PRN

## 2022-08-14 NOTE — Telephone Encounter (Signed)
ATC X2 called patients and Mother's phone unable to LVM will try again later. Calling to advise pt refill of albuterol was sent to patient

## 2022-08-23 ENCOUNTER — Ambulatory Visit: Payer: Federal, State, Local not specified - PPO | Admitting: Student

## 2022-09-16 ENCOUNTER — Other Ambulatory Visit: Payer: Self-pay

## 2022-09-16 ENCOUNTER — Encounter (HOSPITAL_BASED_OUTPATIENT_CLINIC_OR_DEPARTMENT_OTHER): Payer: Self-pay

## 2022-09-16 ENCOUNTER — Emergency Department (HOSPITAL_BASED_OUTPATIENT_CLINIC_OR_DEPARTMENT_OTHER): Payer: Federal, State, Local not specified - PPO

## 2022-09-16 ENCOUNTER — Emergency Department (HOSPITAL_BASED_OUTPATIENT_CLINIC_OR_DEPARTMENT_OTHER)
Admission: EM | Admit: 2022-09-16 | Discharge: 2022-09-16 | Disposition: A | Discharge status: Home / Self Care | Payer: Federal, State, Local not specified - PPO | Attending: Emergency Medicine | Admitting: Emergency Medicine

## 2022-09-16 ENCOUNTER — Other Ambulatory Visit (HOSPITAL_BASED_OUTPATIENT_CLINIC_OR_DEPARTMENT_OTHER): Payer: Self-pay

## 2022-09-16 DIAGNOSIS — J45901 Unspecified asthma with (acute) exacerbation: Secondary | ICD-10-CM | POA: Insufficient documentation

## 2022-09-16 DIAGNOSIS — Z91010 Allergy to peanuts: Secondary | ICD-10-CM | POA: Insufficient documentation

## 2022-09-16 DIAGNOSIS — R0602 Shortness of breath: Secondary | ICD-10-CM | POA: Diagnosis present

## 2022-09-16 DIAGNOSIS — Z1152 Encounter for screening for COVID-19: Secondary | ICD-10-CM | POA: Insufficient documentation

## 2022-09-16 LAB — RESP PANEL BY RT-PCR (RSV, FLU A&B, COVID)  RVPGX2
Influenza A by PCR: NEGATIVE
Influenza B by PCR: NEGATIVE
Resp Syncytial Virus by PCR: NEGATIVE
SARS Coronavirus 2 by RT PCR: NEGATIVE

## 2022-09-16 MED ORDER — IPRATROPIUM-ALBUTEROL 0.5-2.5 (3) MG/3ML IN SOLN
3.0000 mL | Freq: Once | RESPIRATORY_TRACT | Status: AC
Start: 2022-09-16 — End: 2022-09-16
  Administered 2022-09-16: 3 mL via RESPIRATORY_TRACT
  Filled 2022-09-16: qty 3

## 2022-09-16 MED ORDER — PREDNISONE 20 MG PO TABS
40.0000 mg | ORAL_TABLET | Freq: Every day | ORAL | 0 refills | Status: AC
  Filled 2022-09-16: qty 10, 5d supply, fill #0

## 2022-09-16 MED ORDER — PREDNISONE 50 MG PO TABS
60.0000 mg | ORAL_TABLET | Freq: Once | ORAL | Status: AC
  Administered 2022-09-16: 60 mg via ORAL
  Filled 2022-09-16: qty 1

## 2022-09-16 MED ORDER — AEROCHAMBER PLUS FLO-VU SMALL MISC
1.0000 | Freq: Once | Status: AC
  Administered 2022-09-16: 1
  Filled 2022-09-16: qty 1

## 2022-09-16 MED ORDER — ALBUTEROL SULFATE HFA 108 (90 BASE) MCG/ACT IN AERS
2.0000 | INHALATION_SPRAY | Freq: Once | RESPIRATORY_TRACT | Status: AC
  Administered 2022-09-16: 2 via RESPIRATORY_TRACT
  Filled 2022-09-16: qty 6.7

## 2022-09-16 NOTE — ED Triage Notes (Signed)
Patient here POV from Home.  Endorses SOB that began last PM and worsened today.  1 Nebulizer Last PM and 1 Shortly PTA. History of Asthma.  NAD Noted during Triage. A&Ox4. GCS 15. Ambulatory.

## 2022-09-16 NOTE — ED Provider Notes (Signed)
[facility] Provider Note   CSN: [id number] Arrival date & time: 09/16/22  1224     History  Chief Complaint  Patient presents with   Shortness of Breath    Jacqueline [name] is a 22 y.o. female.  Patient here with asthma symptoms for the last few days.  Inhaler at home without much relief.  Has had congestion and some viral symptoms.  Denies any fevers or chills.  Nothing makes it worse or better.  No recent surgery travel.  She is on her menstrual cycle.  No history of blood clots or cardiac history.  The history is provided by the patient.       Home Medications Prior to Admission medications   Medication Sig Start Date End Date Taking? Authorizing Provider  predniSONE (DELTASONE) 20 MG tablet Take 2 tablets (40 mg total) by mouth daily for 5 days. 09/16/22 09/21/22 Yes Chrisandra Wiemers, DO  albuterol (VENTOLIN HFA) 108 (90 Base) MCG/ACT inhaler Inhale 2 puffs into the lungs every 6 (six) hours as needed for wheezing or shortness of breath. 08/14/22   Icard, Octavio Graves, DO  Budeson-Glycopyrrol-Formoterol (BREZTRI AEROSPHERE) 160-9-4.8 MCG/ACT AERO Inhale 2 puffs into the lungs in the morning and at bedtime. 02/14/22   Icard, Octavio Graves, DO  cetirizine (ZYRTEC) 10 MG tablet TAKE 1 TABLET BY MOUTH EVERY DAY 10/22/21   Padgett, Rae Halsted, MD  EPIPEN 2-PAK 0.3 MG/0.3ML SOAJ injection Inject 0.3 mg into the muscle as needed for anaphylaxis. 10/04/21   Kennith Gain, MD  fluticasone (FLONASE) 50 MCG/ACT nasal spray 2 sprays per nostril daily for 1-2 weeks at a time before stopping once nasal congestion improves. 10/04/21   Kennith Gain, MD  guanFACINE (INTUNIV) 1 MG TB24 ER tablet Take 1 tablet (1 mg total) by mouth at bedtime. 05/21/21   Armando Reichert, MD  hydrOXYzine (ATARAX/VISTARIL) 25 MG tablet Take 1 tablet (25 mg total) by mouth 3 (three) times daily as needed for anxiety. 09/01/18   Trude Mcburney, FNP   ipratropium-albuterol (DUONEB) 0.5-2.5 (3) MG/3ML SOLN Take it 2 times scheduled and every 8 hours as needed if needed for shortness of breath and wheezing. 09/16/21   Thurnell Lose, MD  mepolizumab (NUCALA) 100 MG injection Inject 100 mg into the skin every 28 (twenty-eight) days.    [provider]  olopatadine (PATANOL) 0.1 % ophthalmic solution Place 1 drop into both eyes 2 (two) times daily as needed for allergies (Itchy, watery eyes). 10/04/21   Kennith Gain, MD  polyethylene glycol (MIRALAX / GLYCOLAX) 17 g packet Take 17 g by mouth daily. 10/22/21   Horton, Barbette Hair, MD  VENTOLIN HFA 108 (90 Base) MCG/ACT inhaler INHALE 2 PUFFS BY MOUTH EVERY 4 HOURS AS NEEDED FOR WHEEZE OR FOR SHORTNESS OF BREATH 02/11/22   Padgett, Rae Halsted, MD  VYVANSE 60 MG capsule Take 1 capsule (60 mg total) by mouth every morning. 05/21/21   Armando Reichert, MD      Allergies    Bee venom, Eggs or egg-derived products, Other, Peanut-containing drug products, and Pollen extract    Review of Systems   Review of Systems  Physical Exam Updated Vital Signs BP 108/81 (BP Location: Right Arm)   Pulse 89   Temp 98.8 F (37.1 C) (Oral)   Resp 20   Ht '5\' 2"'$  (1.575 m)   Wt 81.6 kg   SpO2 93%   BMI 32.92 kg/m  Physical Exam Vitals and  nursing note reviewed.  Constitutional:      General: She is not in acute distress.    Appearance: She is well-developed. She is not ill-appearing.  HENT:     Head: Normocephalic and atraumatic.     Mouth/Throat:     Mouth: Mucous membranes are moist.  Eyes:     Extraocular Movements: Extraocular movements intact.     Conjunctiva/sclera: Conjunctivae normal.     Pupils: Pupils are equal, round, and reactive to light.  Cardiovascular:     Rate and Rhythm: Normal rate and regular rhythm.     Pulses: Normal pulses.     Heart sounds: Normal heart sounds. No murmur heard. Pulmonary:     Effort: Pulmonary effort is normal. No respiratory distress.      Breath sounds: Wheezing present.  Abdominal:     Palpations: Abdomen is soft.     Tenderness: There is no abdominal tenderness.  Musculoskeletal:        General: No swelling.     Cervical back: Normal range of motion and neck supple.  Skin:    General: Skin is warm and dry.     Capillary Refill: Capillary refill takes less than 2 seconds.  Neurological:     Mental Status: She is alert.  Psychiatric:        Mood and Affect: Mood normal.     ED Results / Procedures / Treatments   Labs (all labs ordered are listed, but only abnormal results are displayed) Labs Reviewed  RESP PANEL BY RT-PCR (RSV, FLU A&B, COVID)  [id number]    EKG None  Radiology No results found.  Procedures Procedures    Medications Ordered in ED Medications  ipratropium-albuterol (DUONEB) 0.5-2.5 (3) MG/3ML nebulizer solution 3 mL (has no administration in time range)  albuterol (VENTOLIN HFA) 108 (90 Base) MCG/ACT inhaler 2 puff (has no administration in time range)  predniSONE (DELTASONE) tablet 60 mg (has no administration in time range)    ED Course/ Medical Decision Making/ A&P                             Medical Decision Making Amount and/or Complexity of Data Reviewed Radiology: ordered.  Risk Prescription drug management.   Jacqueline [name] is here with shortness of breath.  Wheezing on exam.  Normal vitals.  No fever.  No pneumonia or pneumothorax on chest x-ray per my review and interpretation.  Overall suspect asthma exacerbation.  No concern for ACS or PE.  She got DuoNeb treatment, steroids.  Will prescribe prednisone.  Understands return precautions.  Discharged in good condition.  Viral swabs have been sent.  This chart was dictated using voice recognition software.  Despite best efforts to proofread,  errors can occur which can change the documentation meaning.         Final Clinical Impression(s) / ED Diagnoses Final diagnoses:  Exacerbation of asthma, unspecified  asthma severity, unspecified whether persistent    Rx / DC Orders ED Discharge Orders          Ordered    predniSONE (DELTASONE) 20 MG tablet  Daily        09/16/22 [place], [place], DO 09/16/22 1302

## 2022-09-16 NOTE — Discharge Instructions (Signed)
Chest x-ray negative for pneumonia.  Suspect this is a viral process.  You can follow-up your COVID and flu testing in MyChart.  However continue to take steroids.  Take your next dose tomorrow.  Use inhaler 4 puffs every 4 hours as needed.

## 2022-09-29 ENCOUNTER — Emergency Department (HOSPITAL_BASED_OUTPATIENT_CLINIC_OR_DEPARTMENT_OTHER)
Admission: EM | Admit: 2022-09-29 | Discharge: 2022-09-29 | Disposition: A | Discharge status: Home / Self Care | Payer: Federal, State, Local not specified - PPO | Attending: Emergency Medicine | Admitting: Emergency Medicine

## 2022-09-29 ENCOUNTER — Other Ambulatory Visit: Payer: Self-pay

## 2022-09-29 DIAGNOSIS — Z7951 Long term (current) use of inhaled steroids: Secondary | ICD-10-CM | POA: Diagnosis not present

## 2022-09-29 DIAGNOSIS — Z91010 Allergy to peanuts: Secondary | ICD-10-CM | POA: Diagnosis not present

## 2022-09-29 DIAGNOSIS — J4541 Moderate persistent asthma with (acute) exacerbation: Secondary | ICD-10-CM | POA: Diagnosis not present

## 2022-09-29 DIAGNOSIS — R0602 Shortness of breath: Secondary | ICD-10-CM | POA: Diagnosis present

## 2022-09-29 MED ORDER — ALBUTEROL SULFATE HFA 108 (90 BASE) MCG/ACT IN AERS
INHALATION_SPRAY | RESPIRATORY_TRACT | Status: AC
  Filled 2022-09-29: qty 6.7

## 2022-09-29 MED ORDER — PREDNISONE 20 MG PO TABS: ORAL_TABLET | ORAL | 0 refills | Status: DC

## 2022-09-29 MED ORDER — IPRATROPIUM BROMIDE 0.02 % IN SOLN
0.5000 mg | Freq: Once | RESPIRATORY_TRACT | Status: AC
  Administered 2022-09-29: 0.5 mg via RESPIRATORY_TRACT
  Filled 2022-09-29: qty 2.5

## 2022-09-29 MED ORDER — PREDNISONE 50 MG PO TABS
60.0000 mg | ORAL_TABLET | Freq: Once | ORAL | Status: AC
  Administered 2022-09-29: 60 mg via ORAL
  Filled 2022-09-29: qty 1

## 2022-09-29 MED ORDER — ALBUTEROL SULFATE (2.5 MG/3ML) 0.083% IN NEBU
5.0000 mg | INHALATION_SOLUTION | Freq: Once | RESPIRATORY_TRACT | Status: AC
  Administered 2022-09-29: 5 mg via RESPIRATORY_TRACT
  Filled 2022-09-29: qty 6

## 2022-09-29 MED ORDER — ALBUTEROL SULFATE HFA 108 (90 BASE) MCG/ACT IN AERS
2.0000 | INHALATION_SPRAY | RESPIRATORY_TRACT | Status: DC | PRN
  Administered 2022-09-29: 2 via RESPIRATORY_TRACT

## 2022-09-29 NOTE — ED Provider Notes (Signed)
[facility] Provider Note   CSN: [id number] Arrival date & time: 09/29/22  1502     History  Chief Complaint  Patient presents with   Asthma   Wheezing    Jacqueline [name] is a 22 y.o. female.  The history is provided by the patient and medical records. No language interpreter was used.  Asthma  Wheezing    22 year old female with significant history of asthma presenting complaining of shortness of breath.  Patient states she normally use her rescue inhaler 4 times daily.  She ran out of her maintenance inhaler about a month ago.  Due to the weather change, since yesterday she endorsed increased shortness of breath not adequately controlled with home medication prompting this ER visit.  She does not endorse any fever or chills no productive cough no runny nose sneezing or sore throat.  She does not have any other symptoms.  No prior history of PE or DVT.  Home Medications Prior to Admission medications   Medication Sig Start Date End Date Taking? Authorizing Provider  albuterol (VENTOLIN HFA) 108 (90 Base) MCG/ACT inhaler Inhale 2 puffs into the lungs every 6 (six) hours as needed for wheezing or shortness of breath. 08/14/22   Icard, Octavio Graves, DO  Budeson-Glycopyrrol-Formoterol (BREZTRI AEROSPHERE) 160-9-4.8 MCG/ACT AERO Inhale 2 puffs into the lungs in the morning and at bedtime. 02/14/22   Icard, Octavio Graves, DO  cetirizine (ZYRTEC) 10 MG tablet TAKE 1 TABLET BY MOUTH EVERY DAY 10/22/21   Padgett, Rae Halsted, MD  EPIPEN 2-PAK 0.3 MG/0.3ML SOAJ injection Inject 0.3 mg into the muscle as needed for anaphylaxis. 10/04/21   Kennith Gain, MD  fluticasone (FLONASE) 50 MCG/ACT nasal spray 2 sprays per nostril daily for 1-2 weeks at a time before stopping once nasal congestion improves. 10/04/21   Kennith Gain, MD  guanFACINE (INTUNIV) 1 MG TB24 ER tablet Take 1 tablet (1 mg total) by mouth at bedtime. 05/21/21    Armando Reichert, MD  hydrOXYzine (ATARAX/VISTARIL) 25 MG tablet Take 1 tablet (25 mg total) by mouth 3 (three) times daily as needed for anxiety. 09/01/18   Trude Mcburney, FNP  ipratropium-albuterol (DUONEB) 0.5-2.5 (3) MG/3ML SOLN Take it 2 times scheduled and every 8 hours as needed if needed for shortness of breath and wheezing. 09/16/21   Thurnell Lose, MD  mepolizumab (NUCALA) 100 MG injection Inject 100 mg into the skin every 28 (twenty-eight) days.    [provider]  olopatadine (PATANOL) 0.1 % ophthalmic solution Place 1 drop into both eyes 2 (two) times daily as needed for allergies (Itchy, watery eyes). 10/04/21   Kennith Gain, MD  polyethylene glycol (MIRALAX / GLYCOLAX) 17 g packet Take 17 g by mouth daily. 10/22/21   Horton, Barbette Hair, MD  VENTOLIN HFA 108 (90 Base) MCG/ACT inhaler INHALE 2 PUFFS BY MOUTH EVERY 4 HOURS AS NEEDED FOR WHEEZE OR FOR SHORTNESS OF BREATH 02/11/22   Padgett, Rae Halsted, MD  VYVANSE 60 MG capsule Take 1 capsule (60 mg total) by mouth every morning. 05/21/21   Armando Reichert, MD      Allergies    Bee venom, Egg-derived products, Other, Peanut-containing drug products, and Pollen extract    Review of Systems   Review of Systems  Respiratory:  Positive for wheezing.   All other systems reviewed and are negative.   Physical Exam Updated Vital Signs BP 132/82 (BP Location: Right Arm)   Pulse 94  Temp 98 F (36.7 C)   Resp 18   Ht '5\' 2"'$  (1.575 m)   Wt 81.6 kg   LMP 09/15/2022   SpO2 96%   BMI 32.90 kg/m  Physical Exam Vitals and nursing note reviewed.  Constitutional:      General: She is not in acute distress.    Appearance: She is well-developed. She is obese.  HENT:     Head: Atraumatic.     Nose: Nose normal.  Eyes:     Conjunctiva/sclera: Conjunctivae normal.  Cardiovascular:     Rate and Rhythm: Normal rate and regular rhythm.     Pulses: Normal pulses.     Heart sounds: Normal heart sounds.   Pulmonary:     Effort: Pulmonary effort is normal.     Breath sounds: Wheezing present. No rhonchi or rales.  Abdominal:     Palpations: Abdomen is soft.     Tenderness: There is no abdominal tenderness.  Musculoskeletal:     Cervical back: Neck supple.  Skin:    Findings: No rash.  Neurological:     Mental Status: She is alert.  Psychiatric:        Mood and Affect: Mood normal.     ED Results / Procedures / Treatments   Labs (all labs ordered are listed, but only abnormal results are displayed) Labs Reviewed - No data to display  EKG None  Radiology No results found.  Procedures Procedures    Medications Ordered in ED Medications  albuterol (VENTOLIN HFA) 108 (90 Base) MCG/ACT inhaler 2 puff (2 puffs Inhalation Given 09/29/22 1526)  albuterol (VENTOLIN HFA) 108 (90 Base) MCG/ACT inhaler (has no administration in time range)  albuterol (PROVENTIL) (2.5 MG/3ML) 0.083% nebulizer solution 5 mg (5 mg Nebulization Given 09/29/22 1520)  predniSONE (DELTASONE) tablet 60 mg (60 mg Oral Given 09/29/22 1528)  ipratropium (ATROVENT) nebulizer solution 0.5 mg (0.5 mg Nebulization Given 09/29/22 1529)    ED Course/ Medical Decision Making/ A&P                             Medical Decision Making Risk Prescription drug management.   BP 132/82 (BP Location: Right Arm)   Pulse 94   Temp 98 F (36.7 C)   Resp 18   Ht '5\' 2"'$  (1.575 m)   Wt 81.6 kg   LMP 09/15/2022   SpO2 96%   BMI 32.90 kg/m   10:68 PM 22 year old female with significant history of asthma presenting complaining of shortness of breath.  Patient states she normally use her rescue inhaler 4 times daily.  She ran out of her maintenance inhaler about a month ago.  Due to the weather change, since yesterday she endorsed increased shortness of breath not adequately controlled with home medication prompting this ER visit.  She does not endorse any fever or chills no productive cough no runny nose sneezing or sore  throat.  She does not have any other symptoms.  No prior history of PE or DVT.  On exam, obese female laying in bed currently on nebulizing treatment.  She appears to be in no acute respiratory discomfort.  She does have both inspiratory and expiratory wheezes on exam.  Abdomen is soft nontender.  Heart with regular rate and rhythm  Vital signs reviewed and overall reassuring no fever no hypoxia.  Suspect asthma exacerbation due to weather change.  Low suspicion for PE, pneumonia, viral illness, or ACS.  Imaging considered  including chest x-ray but at this time I have low suspicion for pneumonia, PTX, pleural effusion, or other acute emergent medical condition.  Patient provided with prednisone and DuoNebs treatment.  Will monitor closely.  4:31 PM After receiving treatment patient states she felt much better.  On reexamination lung sounds improved.  She feels comfortable going home.  Patient was provide rescue inhaler and will prescribe patient with prednisone for the next several days.  Return precaution given.        Final Clinical Impression(s) / ED Diagnoses Final diagnoses:  Moderate persistent asthma with exacerbation    Rx / DC Orders ED Discharge Orders          Ordered    predniSONE (DELTASONE) 20 MG tablet        09/29/22 1632              Domenic Moras, PA-C 09/29/22 1632    Blanchie Dessert, MD 10/02/22 [id number]

## 2022-09-29 NOTE — ED Triage Notes (Signed)
Patient arrives with complaints of wheezing/asthma flare up x1 day. Unable to get new inhaler at the time until prescription in renewed.

## 2022-09-29 NOTE — ED Notes (Signed)
Pt verbalized understanding of d/c instructions, meds, and followup care. Denies questions. VSS, no distress noted. Steady gait to exit with all belongings.  ?

## 2022-09-30 NOTE — Telephone Encounter (Signed)
1 

## 2022-10-09 ENCOUNTER — Emergency Department (HOSPITAL_BASED_OUTPATIENT_CLINIC_OR_DEPARTMENT_OTHER): Payer: Federal, State, Local not specified - PPO | Admitting: Radiology

## 2022-10-09 ENCOUNTER — Other Ambulatory Visit: Payer: Self-pay

## 2022-10-09 ENCOUNTER — Emergency Department (HOSPITAL_BASED_OUTPATIENT_CLINIC_OR_DEPARTMENT_OTHER)
Admission: EM | Admit: 2022-10-09 | Discharge: 2022-10-09 | Disposition: A | Discharge status: Home / Self Care | Payer: Federal, State, Local not specified - PPO | Attending: Emergency Medicine | Admitting: Emergency Medicine

## 2022-10-09 ENCOUNTER — Encounter (HOSPITAL_BASED_OUTPATIENT_CLINIC_OR_DEPARTMENT_OTHER): Payer: Self-pay

## 2022-10-09 DIAGNOSIS — Z91010 Allergy to peanuts: Secondary | ICD-10-CM | POA: Diagnosis not present

## 2022-10-09 DIAGNOSIS — Z7951 Long term (current) use of inhaled steroids: Secondary | ICD-10-CM | POA: Diagnosis not present

## 2022-10-09 DIAGNOSIS — J4521 Mild intermittent asthma with (acute) exacerbation: Secondary | ICD-10-CM | POA: Insufficient documentation

## 2022-10-09 DIAGNOSIS — Z20822 Contact with and (suspected) exposure to covid-19: Secondary | ICD-10-CM | POA: Insufficient documentation

## 2022-10-09 DIAGNOSIS — Z7952 Long term (current) use of systemic steroids: Secondary | ICD-10-CM | POA: Diagnosis not present

## 2022-10-09 DIAGNOSIS — R0602 Shortness of breath: Secondary | ICD-10-CM | POA: Diagnosis present

## 2022-10-09 LAB — RESP PANEL BY RT-PCR (RSV, FLU A&B, COVID)  RVPGX2
Influenza A by PCR: NEGATIVE
Influenza B by PCR: NEGATIVE
Resp Syncytial Virus by PCR: NEGATIVE
SARS Coronavirus 2 by RT PCR: NEGATIVE

## 2022-10-09 MED ORDER — PREDNISONE 20 MG PO TABS: 60.0000 mg | ORAL_TABLET | Freq: Every day | ORAL | 0 refills | Status: DC

## 2022-10-09 MED ORDER — PREDNISONE 50 MG PO TABS
60.0000 mg | ORAL_TABLET | Freq: Once | ORAL | Status: AC
  Administered 2022-10-09: 60 mg via ORAL
  Filled 2022-10-09: qty 1

## 2022-10-09 MED ORDER — ALBUTEROL SULFATE HFA 108 (90 BASE) MCG/ACT IN AERS: 2.0000 | INHALATION_SPRAY | RESPIRATORY_TRACT | 1 refills | Status: DC | PRN

## 2022-10-09 MED ORDER — ALBUTEROL SULFATE (2.5 MG/3ML) 0.083% IN NEBU
5.0000 mg | INHALATION_SOLUTION | Freq: Once | RESPIRATORY_TRACT | Status: AC
  Administered 2022-10-09: 5 mg via RESPIRATORY_TRACT
  Filled 2022-10-09: qty 6

## 2022-10-09 MED ORDER — ALBUTEROL SULFATE (2.5 MG/3ML) 0.083% IN NEBU
2.5000 mg | INHALATION_SOLUTION | Freq: Once | RESPIRATORY_TRACT | Status: AC
  Administered 2022-10-09: 2.5 mg via RESPIRATORY_TRACT
  Filled 2022-10-09: qty 3

## 2022-10-09 NOTE — ED Triage Notes (Signed)
Patient here POV from Home.  Endorses SOB related to Asthma Exacerbation that began today a few hours. No Fever or Cough. 3 Pumps of Inhaler without much relief.   NAD Noted during Triage. A&Ox4. GCS 15. Ambulatory.

## 2022-10-09 NOTE — ED Provider Notes (Signed)
[facility] Provider [facility]   CSN: [id number] Arrival date & time: 10/09/22  1541     History  Chief Complaint  Patient presents with   Shortness of Breath    Jacqueline [name] is a 22 y.o. female.  Pt with hx asthma, c/o increased wheezing/sob in the past day. Indicates has environmental allergy to pollen. Non smoker. Occasional non prod cough. No sore throat. No chest pain. No leg pain or swelling. No pleuritic symptoms or pain. Uses mdi prn, 1-2 x/day. Denies fever or chills. No current oral steroids +recent.  No recent abx use.   The history is provided by the patient and medical records.  Shortness of Breath Associated symptoms: cough and wheezing   Associated symptoms: no abdominal pain, no chest pain, no fever, no headaches, no neck pain, no rash, no sore throat and no vomiting        Home Medications Prior to Admission medications   Medication Sig Start Date End Date Taking? Authorizing Provider  albuterol (VENTOLIN HFA) 108 (90 Base) MCG/ACT inhaler Inhale 2 puffs into the lungs every 4 (four) hours as needed for wheezing or shortness of breath. 10/09/22  Yes Lajean Saver, MD  predniSONE (DELTASONE) 20 MG tablet Take 3 tablets (60 mg total) by mouth daily. 10/10/22  Yes Lajean Saver, MD  albuterol (VENTOLIN HFA) 108 (90 Base) MCG/ACT inhaler Inhale 2 puffs into the lungs every 6 (six) hours as needed for wheezing or shortness of breath. 08/14/22   Icard, Octavio Graves, DO  Budeson-Glycopyrrol-Formoterol (BREZTRI AEROSPHERE) 160-9-4.8 MCG/ACT AERO Inhale 2 puffs into the lungs in the morning and at bedtime. 02/14/22   Icard, Octavio Graves, DO  cetirizine (ZYRTEC) 10 MG tablet TAKE 1 TABLET BY MOUTH EVERY DAY 10/22/21   Padgett, Rae Halsted, MD  EPIPEN 2-PAK 0.3 MG/0.3ML SOAJ injection Inject 0.3 mg into the muscle as needed for anaphylaxis. 10/04/21   Kennith Gain, MD  fluticasone (FLONASE) 50 MCG/ACT nasal spray 2 sprays  per nostril daily for 1-2 weeks at a time before stopping once nasal congestion improves. 10/04/21   Kennith Gain, MD  guanFACINE (INTUNIV) 1 MG TB24 ER tablet Take 1 tablet (1 mg total) by mouth at bedtime. 05/21/21   Armando Reichert, MD  hydrOXYzine (ATARAX/VISTARIL) 25 MG tablet Take 1 tablet (25 mg total) by mouth 3 (three) times daily as needed for anxiety. 09/01/18   Trude Mcburney, FNP  ipratropium-albuterol (DUONEB) 0.5-2.5 (3) MG/3ML SOLN Take it 2 times scheduled and every 8 hours as needed if needed for shortness of breath and wheezing. 09/16/21   Thurnell Lose, MD  mepolizumab (NUCALA) 100 MG injection Inject 100 mg into the skin every 28 (twenty-eight) days.    [provider]  olopatadine (PATANOL) 0.1 % ophthalmic solution Place 1 drop into both eyes 2 (two) times daily as needed for allergies (Itchy, watery eyes). 10/04/21   Kennith Gain, MD  polyethylene glycol (MIRALAX / GLYCOLAX) 17 g packet Take 17 g by mouth daily. 10/22/21   Horton, Barbette Hair, MD  predniSONE (DELTASONE) 20 MG tablet 3 tabs po day one, then 2 tabs daily x 4 days 09/29/22   Domenic Moras, PA-C  VENTOLIN HFA 108 (90 Base) MCG/ACT inhaler INHALE 2 PUFFS BY MOUTH EVERY 4 HOURS AS NEEDED FOR WHEEZE OR FOR SHORTNESS OF BREATH 02/11/22   Padgett, Rae Halsted, MD  VYVANSE 60 MG capsule Take 1 capsule (60 mg total) by mouth every morning. 05/21/21  Armando Reichert, MD      Allergies    Bee venom, Egg-derived products, Other, Peanut-containing drug products, and Pollen extract    Review of Systems   Review of Systems  Constitutional:  Negative for chills and fever.  HENT:  Negative for sore throat.   Eyes:  Negative for redness.  Respiratory:  Positive for cough, shortness of breath and wheezing.   Cardiovascular:  Negative for chest pain and leg swelling.  Gastrointestinal:  Negative for abdominal pain and vomiting.  Genitourinary:  Negative for flank pain.  Musculoskeletal:   Negative for back pain and neck pain.  Skin:  Negative for rash.  Neurological:  Negative for headaches.  Hematological:  Does not bruise/bleed easily.  Psychiatric/Behavioral:  Negative for confusion.     Physical Exam Updated Vital Signs BP (!) 132/91 (BP Location: Right Arm)   Pulse 65   Temp 98 F (36.7 C)   Resp 11   Ht 1.575 m (5\' 2" )   Wt 81.6 kg   LMP 09/15/2022   SpO2 100%   BMI 32.90 kg/m  Physical Exam Vitals and nursing note reviewed.  Constitutional:      Appearance: Normal appearance. She is well-developed.  HENT:     Head: Atraumatic.     Nose: Nose normal.     Mouth/Throat:     Mouth: Mucous membranes are moist.     Pharynx: Oropharynx is clear.  Eyes:     General: No scleral icterus.    Conjunctiva/sclera: Conjunctivae normal.  Neck:     Trachea: No tracheal deviation.     Comments: No stiffness or rigidity Cardiovascular:     Rate and Rhythm: Normal rate and regular rhythm.     Pulses: Normal pulses.     Heart sounds: Normal heart sounds. No murmur heard.    No friction rub. No gallop.  Pulmonary:     Effort: Pulmonary effort is normal. No respiratory distress.     Breath sounds: Wheezing present.  Abdominal:     General: Bowel sounds are normal. There is no distension.     Palpations: Abdomen is soft.     Tenderness: There is no abdominal tenderness.  Genitourinary:    Comments: No cva tenderness.  Musculoskeletal:        General: No swelling or tenderness.     Cervical back: Normal range of motion and neck supple. No rigidity or tenderness. No muscular tenderness.     Right lower leg: No edema.     Left lower leg: No edema.  Lymphadenopathy:     Cervical: No cervical adenopathy.  Skin:    General: Skin is warm and dry.     Findings: No rash.  Neurological:     Mental Status: She is alert.     Comments: Alert, speech normal.   Psychiatric:        Mood and Affect: Mood normal.     ED Results / Procedures / Treatments   Labs (all  labs ordered are listed, but only abnormal results are displayed) Results for orders placed or performed during the hospital encounter of 10/09/22  Resp panel by RT-PCR (RSV, Flu A&B, Covid) Anterior Nasal Swab   Specimen: Anterior Nasal Swab  Result Value Ref Range   SARS Coronavirus 2 by RT PCR NEGATIVE NEGATIVE   Influenza A by PCR NEGATIVE NEGATIVE   Influenza B by PCR NEGATIVE NEGATIVE   Resp Syncytial Virus by PCR NEGATIVE NEGATIVE   DG Chest 2 View  Result  Date: 10/09/2022 CLINICAL DATA:  Shortness of breath. EXAM: CHEST - 2 VIEW COMPARISON:  Chest radiographs 09/16/2022 and 05/21/2022 FINDINGS: Cardiac silhouette and mediastinal contours are within normal limits. The lungs are clear. No pleural effusion or pneumothorax. No acute skeletal abnormality. IMPRESSION: No active cardiopulmonary disease. Electronically Signed   By: Yvonne Kendall M.D.   On: 10/09/2022 17:32   DG Chest Portable 1 View  Result Date: 09/16/2022 CLINICAL DATA:  Shortness of breath, asthma. EXAM: PORTABLE CHEST 1 VIEW COMPARISON:  05/21/2022. FINDINGS: Trachea is midline. Heart size normal. Lungs are clear. No pleural fluid. IMPRESSION: No acute findings. Electronically Signed   By: Lorin Picket M.D.   On: 09/16/2022 13:08     EKG None  Radiology DG Chest 2 View  Result Date: 10/09/2022 CLINICAL DATA:  Shortness of breath. EXAM: CHEST - 2 VIEW COMPARISON:  Chest radiographs 09/16/2022 and 05/21/2022 FINDINGS: Cardiac silhouette and mediastinal contours are within normal limits. The lungs are clear. No pleural effusion or pneumothorax. No acute skeletal abnormality. IMPRESSION: No active cardiopulmonary disease. Electronically Signed   By: Yvonne Kendall M.D.   On: 10/09/2022 17:32    Procedures Procedures    Medications Ordered in ED Medications  albuterol (PROVENTIL) (2.5 MG/3ML) 0.083% nebulizer solution 2.5 mg (2.5 mg Nebulization Given 10/09/22 1600)  albuterol (PROVENTIL) (2.5 MG/3ML) 0.083%  nebulizer solution 5 mg (5 mg Nebulization Given 10/09/22 1649)  predniSONE (DELTASONE) tablet 60 mg (60 mg Oral Given 10/09/22 1800)    ED Course/ Medical Decision Making/ A&P                             Medical Decision Making Problems Addressed: Mild intermittent asthma with exacerbation: acute illness or injury with systemic symptoms that poses a threat to life or bodily functions  Amount and/or Complexity of Data Reviewed External Data Reviewed: notes. Labs: ordered. Decision-making details documented in ED Course. Radiology: ordered and independent interpretation performed. Decision-making details documented in ED Course.  Risk Prescription drug management. Decision regarding hospitalization.   Iv ns. Continuous pulse ox and cardiac monitoring. Labs ordered/sent. Imaging ordered.   Differential diagnosis includes asthma exacerbation, viral uri, pna, etc  . Dispo decision including potential need for admission considered - will get labs and imaging and reassess.   Reviewed nursing notes and prior charts for additional history. External reports reviewed.   Cardiac monitor: sinus rhythm, rate 90.  Labs reviewed/interpreted by me - covid and flu neg.   Xrays reviewed/interpreted by me -  no pna.   Albuterol tx. Prednisone po.   Recheck wheezing resolved. Breathing comfortably.  Pt currently appears stable for d/c.   Rec pcp f/u. Return precautions provided.          Final Clinical Impression(s) / ED Diagnoses Final diagnoses:  Mild intermittent asthma with exacerbation    Rx / DC Orders ED Discharge Orders          Ordered    predniSONE (DELTASONE) 20 MG tablet  Daily        10/09/22 1821    albuterol (VENTOLIN HFA) 108 (90 Base) MCG/ACT inhaler  Every 4 hours PRN        10/09/22 1821              Lajean Saver, MD 10/09/22 Vernelle Emerald

## 2022-10-09 NOTE — ED Notes (Signed)
Requested RT assess pt due to room air O2 saturation 89%

## 2022-10-09 NOTE — Discharge Instructions (Addendum)
It was our pleasure to provide your ER care today - we hope that you feel better.  Take prednisone as prescribed. Use albuterol inhaler as need.  Follow up closely with primary care doctor in the coming week if symptoms fail to improve/resolve.  Return to ER if worse, new symptoms, fevers, chest pain, increased trouble breathing, or other concern.

## 2022-10-23 ENCOUNTER — Other Ambulatory Visit: Payer: Self-pay

## 2022-10-23 ENCOUNTER — Emergency Department (HOSPITAL_BASED_OUTPATIENT_CLINIC_OR_DEPARTMENT_OTHER)
Admission: EM | Admit: 2022-10-23 | Discharge: 2022-10-24 | Disposition: A | Discharge status: Home / Self Care | Payer: Federal, State, Local not specified - PPO | Attending: Emergency Medicine | Admitting: Emergency Medicine

## 2022-10-23 ENCOUNTER — Emergency Department (HOSPITAL_BASED_OUTPATIENT_CLINIC_OR_DEPARTMENT_OTHER): Payer: Federal, State, Local not specified - PPO | Admitting: Radiology

## 2022-10-23 DIAGNOSIS — Z7951 Long term (current) use of inhaled steroids: Secondary | ICD-10-CM | POA: Insufficient documentation

## 2022-10-23 DIAGNOSIS — J301 Allergic rhinitis due to pollen: Secondary | ICD-10-CM | POA: Diagnosis not present

## 2022-10-23 DIAGNOSIS — J4541 Moderate persistent asthma with (acute) exacerbation: Secondary | ICD-10-CM | POA: Diagnosis not present

## 2022-10-23 DIAGNOSIS — Z1152 Encounter for screening for COVID-19: Secondary | ICD-10-CM | POA: Insufficient documentation

## 2022-10-23 DIAGNOSIS — R0602 Shortness of breath: Secondary | ICD-10-CM | POA: Diagnosis present

## 2022-10-23 DIAGNOSIS — J302 Other seasonal allergic rhinitis: Secondary | ICD-10-CM

## 2022-10-23 LAB — RESP PANEL BY RT-PCR (RSV, FLU A&B, COVID)  RVPGX2
Influenza A by PCR: NEGATIVE
Influenza B by PCR: NEGATIVE
Resp Syncytial Virus by PCR: NEGATIVE
SARS Coronavirus 2 by RT PCR: NEGATIVE

## 2022-10-23 MED ORDER — DEXAMETHASONE SODIUM PHOSPHATE 10 MG/ML IJ SOLN
10.0000 mg | Freq: Once | INTRAMUSCULAR | Status: AC
  Administered 2022-10-23: 10 mg via INTRAVENOUS
  Filled 2022-10-23: qty 1

## 2022-10-23 MED ORDER — ALBUTEROL SULFATE (2.5 MG/3ML) 0.083% IN NEBU: 2.5000 mg | INHALATION_SOLUTION | Freq: Once | RESPIRATORY_TRACT | Status: AC

## 2022-10-23 MED ORDER — IPRATROPIUM-ALBUTEROL 0.5-2.5 (3) MG/3ML IN SOLN
3.0000 mL | RESPIRATORY_TRACT | Status: AC
  Administered 2022-10-23 (×3): 3 mL via RESPIRATORY_TRACT
  Filled 2022-10-23: qty 9

## 2022-10-23 MED ORDER — IPRATROPIUM-ALBUTEROL 0.5-2.5 (3) MG/3ML IN SOLN
RESPIRATORY_TRACT | Status: AC
  Administered 2022-10-23: 3 mL via RESPIRATORY_TRACT
  Filled 2022-10-23: qty 3

## 2022-10-23 MED ORDER — MAGNESIUM SULFATE 2 GM/50ML IV SOLN
2.0000 g | Freq: Once | INTRAVENOUS | Status: AC
  Administered 2022-10-23: 2 g via INTRAVENOUS
  Filled 2022-10-23: qty 50

## 2022-10-23 MED ORDER — ALBUTEROL SULFATE (2.5 MG/3ML) 0.083% IN NEBU
INHALATION_SOLUTION | RESPIRATORY_TRACT | Status: AC
  Administered 2022-10-23: 2.5 mg via RESPIRATORY_TRACT
  Filled 2022-10-23: qty 3

## 2022-10-23 MED ORDER — IPRATROPIUM-ALBUTEROL 0.5-2.5 (3) MG/3ML IN SOLN: 3.0000 mL | Freq: Once | RESPIRATORY_TRACT | Status: AC

## 2022-10-23 MED ORDER — IPRATROPIUM-ALBUTEROL 0.5-2.5 (3) MG/3ML IN SOLN
3.0000 mL | RESPIRATORY_TRACT | Status: DC
  Administered 2022-10-23: 3 mL via RESPIRATORY_TRACT
  Filled 2022-10-23: qty 3

## 2022-10-23 NOTE — ED Triage Notes (Signed)
Pt arrives via POV d/t asthma exacerbation. States running out of her inhaler. Wheezing in triage.

## 2022-10-23 NOTE — ED Provider Notes (Signed)
[facility] Provider Note   CSN: [id number] Arrival date & time: 10/23/22  2050     History  Chief Complaint  Patient presents with   Shortness of Breath    Jacqueline [name] is a 22 y.o. female with PMH asthma, seasonal allergies, anxiety who presents to the ED complaining of an asthma exacerbation that started yesterday.  Patient reports that she frequently has asthma exacerbations during the spring.  She has recently had associated nasal congestion and believes that her allergies are acting up.  She denies known sick contacts but some concern for COVID in her dorm.  She denies associated fever, chills, nausea with vomiting, diarrhea, chest pain, palpitations.  Reports that she was using her albuterol inhaler at home but use it so much that she ran out and thus came to the ED for evaluation.      Home Medications Prior to Admission medications   Medication Sig Start Date End Date Taking? Authorizing Provider  albuterol (VENTOLIN HFA) 108 (90 Base) MCG/ACT inhaler Inhale 1-2 puffs into the lungs every 6 (six) hours as needed for wheezing or shortness of breath. 10/24/22 11/23/22 Yes Erastus Bartolomei L, PA-C  predniSONE (DELTASONE) 10 MG tablet Take 4 tablets (40 mg total) by mouth daily for 4 days. 10/24/22 10/28/22 Yes Terilynn Buresh L, PA-C  Budeson-Glycopyrrol-Formoterol (BREZTRI AEROSPHERE) 160-9-4.8 MCG/ACT AERO Inhale 2 puffs into the lungs in the morning and at bedtime. 02/14/22   Icard, Octavio Graves, DO  cetirizine (ZYRTEC) 10 MG tablet TAKE 1 TABLET BY MOUTH EVERY DAY 10/22/21   Padgett, Rae Halsted, MD  EPIPEN 2-PAK 0.3 MG/0.3ML SOAJ injection Inject 0.3 mg into the muscle as needed for anaphylaxis. 10/04/21   Kennith Gain, MD  fluticasone (FLONASE) 50 MCG/ACT nasal spray 2 sprays per nostril daily for 1-2 weeks at a time before stopping once nasal congestion improves. 10/04/21   Kennith Gain, MD  guanFACINE (INTUNIV) 1  MG TB24 ER tablet Take 1 tablet (1 mg total) by mouth at bedtime. 05/21/21   Armando Reichert, MD  hydrOXYzine (ATARAX/VISTARIL) 25 MG tablet Take 1 tablet (25 mg total) by mouth 3 (three) times daily as needed for anxiety. 09/01/18   Trude Mcburney, FNP  ipratropium-albuterol (DUONEB) 0.5-2.5 (3) MG/3ML SOLN Take it 2 times scheduled and every 8 hours as needed if needed for shortness of breath and wheezing. 09/16/21   Thurnell Lose, MD  mepolizumab (NUCALA) 100 MG injection Inject 100 mg into the skin every 28 (twenty-eight) days.    [provider]  olopatadine (PATANOL) 0.1 % ophthalmic solution Place 1 drop into both eyes 2 (two) times daily as needed for allergies (Itchy, watery eyes). 10/04/21   Kennith Gain, MD  polyethylene glycol (MIRALAX / GLYCOLAX) 17 g packet Take 17 g by mouth daily. 10/22/21   Horton, Barbette Hair, MD  VYVANSE 60 MG capsule Take 1 capsule (60 mg total) by mouth every morning. 05/21/21   Armando Reichert, MD      Allergies    Bee venom, Egg-derived products, Other, Peanut-containing drug products, and Pollen extract    Review of Systems   Review of Systems  All other systems reviewed and are negative.   Physical Exam Updated Vital Signs BP 127/79 (BP Location: Left Arm)   Pulse 88   Temp 98.7 F (37.1 C) (Axillary)   Resp (!) 27   SpO2 94%  Physical Exam Vitals and nursing note reviewed.  Constitutional:  General: She is not in acute distress.    Appearance: Normal appearance. She is not ill-appearing or toxic-appearing.  HENT:     Head: Normocephalic and atraumatic.     Nose: Congestion present.     Mouth/Throat:     Mouth: Mucous membranes are moist.     Pharynx: Oropharynx is clear. No pharyngeal swelling or oropharyngeal exudate.  Eyes:     Conjunctiva/sclera: Conjunctivae normal.  Cardiovascular:     Rate and Rhythm: Normal rate and regular rhythm.     Heart sounds: No murmur heard. Pulmonary:     Effort: Tachypnea  (mild) present. No accessory muscle usage or respiratory distress.     Breath sounds: No stridor. Wheezing (diffuse inspiratory and expiratory, moderate) present. No rhonchi or rales.     Comments: Speaking in full sentences with ease without signs of respiratory distress Abdominal:     General: Abdomen is flat.     Palpations: Abdomen is soft.     Tenderness: There is no abdominal tenderness.  Musculoskeletal:        General: Normal range of motion.     Cervical back: Neck supple.     Right lower leg: No tenderness. No edema.     Left lower leg: No tenderness. No edema.  Skin:    General: Skin is warm and dry.     Capillary Refill: Capillary refill takes less than 2 seconds.  Neurological:     Mental Status: She is alert. Mental status is at baseline.  Psychiatric:        Mood and Affect: Mood normal.        Behavior: Behavior normal.     ED Results / Procedures / Treatments   Labs (all labs ordered are listed, but only abnormal results are displayed) Labs Reviewed  RESP PANEL BY RT-PCR (RSV, FLU A&B, COVID)  RVPGX2    EKG None  Radiology DG Chest 2 View  Result Date: 10/23/2022 CLINICAL DATA:  Shortness of breath.  Asthma EXAM: CHEST - 2 VIEW COMPARISON:  X-ray 10/09/2022 and older FINDINGS: The heart size and mediastinal contours are within normal limits. No consolidation, pneumothorax or effusion. No edema. The visualized skeletal structures are unremarkable. Air-fluid level along the stomach beneath the left hemidiaphragm. IMPRESSION: No acute cardiopulmonary disease. Electronically Signed   By: Jill Side M.D.   On: 10/23/2022 21:48    Procedures Procedures    Medications Ordered in ED Medications  albuterol (PROVENTIL,VENTOLIN) solution continuous neb (has no administration in time range)  albuterol (PROVENTIL) (2.5 MG/3ML) 0.083% nebulizer solution 2.5 mg (2.5 mg Nebulization Given 10/23/22 2115)  ipratropium-albuterol (DUONEB) 0.5-2.5 (3) MG/3ML nebulizer  solution 3 mL (3 mLs Nebulization Given 10/23/22 2115)  dexamethasone (DECADRON) injection 10 mg (10 mg Intravenous Given 10/23/22 2254)  magnesium sulfate IVPB 2 g 50 mL (0 g Intravenous Stopped 10/24/22 0011)  ipratropium-albuterol (DUONEB) 0.5-2.5 (3) MG/3ML nebulizer solution 3 mL (3 mLs Nebulization Given 10/23/22 2328)    ED Course/ Medical Decision Making/ A&P                             Medical Decision Making Amount and/or Complexity of Data Reviewed Labs: ordered. Decision-making details documented in ED Course. Radiology: ordered. Decision-making details documented in ED Course.  Risk Prescription drug management.   Medical Decision Making:   Jacqueline [name] is a 22 y.o. female who presented to the ED today with shortness of breath detailed above.  Patient's presentation is complicated by their history of asthma, seasonal allergies.  Patient placed on continuous vitals and telemetry monitoring while in ED which was reviewed periodically.  Complete initial physical exam performed, notably the patient  was in acute distress.  She had moderate inspiratory and expiratory wheezing diffusely.  Mild tachypnea.  No nasal flaring, no retractions, speaking in full sentences with ease without signs of acute respiratory distress. Patent airway.   Reviewed and confirmed nursing documentation for past medical history, family history, social history.    Initial Assessment:   With the patient's presentation of shortness of breath, most likely diagnosis is asthma exacerbation. The causes for shortness of breath include but are not limited to Cardiac (AHF, pericardial effusion and tamponade, arrhythmias, ischemia, etc) Respiratory (COPD, asthma, pneumonia, pneumothorax, primary pulmonary hypertension, PE/VQ mismatch) Hematological (anemia) Neuromuscular (ALS, Guillain-Barr, etc)  This is most consistent with an acute complicated illness  Initial Plan:  Viral swabs  CXR to evaluate for  structural/infectious intrathoracic pathology.  Decadron, magnesium, multiple breathing treatments and reassessments by RT and myself Objective evaluation as reviewed   Initial Study Results:   Laboratory  All laboratory results reviewed without evidence of clinically relevant pathology.    Radiology:  All images reviewed independently. Agree with radiology report at this time.   DG Chest 2 View  Result Date: 10/23/2022 CLINICAL DATA:  Shortness of breath.  Asthma EXAM: CHEST - 2 VIEW COMPARISON:  X-ray 10/09/2022 and older FINDINGS: The heart size and mediastinal contours are within normal limits. No consolidation, pneumothorax or effusion. No edema. The visualized skeletal structures are unremarkable. Air-fluid level along the stomach beneath the left hemidiaphragm. IMPRESSION: No acute cardiopulmonary disease. Electronically Signed   By: Jill Side M.D.   On: 10/23/2022 21:48   DG Chest 2 View  Result Date: 10/09/2022 CLINICAL DATA:  Shortness of breath. EXAM: CHEST - 2 VIEW COMPARISON:  Chest radiographs 09/16/2022 and 05/21/2022 FINDINGS: Cardiac silhouette and mediastinal contours are within normal limits. The lungs are clear. No pleural effusion or pneumothorax. No acute skeletal abnormality. IMPRESSION: No active cardiopulmonary disease. Electronically Signed   By: Yvonne Kendall M.D.   On: 10/09/2022 17:32      Final Assessment and Plan:   This is a 22 year old female with a past medical history asthma and allergies who presents to the ED with an asthma exacerbation.  Patient reports shortness of breath and wheezing as well as nasal congestion since yesterday.  She reports symptoms typical for her asthma.  She has been using her albuterol inhaler at home but without resolution of symptoms and has since run out of her inhaler.  She has follow-up scheduled with her asthma and allergy doctor next Wednesday but as she ran out of her inhaler came to the ED for further evaluation.  No  associated chest pain, fever, chills, nausea, or vomiting.  Possible exposure to COVID in the dorms but no identifiable sick contacts the patient has been in close contact with.  On exam, patient is nontoxic-appearing.  She does have moderate inspiratory and expiratory diffuse wheezing but is able to speak in full sentences without signs of distress.  Normal oropharyngeal exam. Multiple breathing treatments administered by RT in addition to Mg and Decadron and pt gradually with improvement following each treatment but still with continued wheezing and complaint of symptoms. Care handed off to Dr. Dina Rich, pending further treatment, recheck, and disposition.    Clinical Impression:  1. Moderate persistent asthma with exacerbation   2.  Seasonal allergies      Data Unavailable           Final Clinical Impression(s) / ED Diagnoses Final diagnoses:  Moderate persistent asthma with exacerbation  Seasonal allergies    Rx / DC Orders ED Discharge Orders          Ordered    albuterol (VENTOLIN HFA) 108 (90 Base) MCG/ACT inhaler  Every 6 hours PRN        10/24/22 0010    predniSONE (DELTASONE) 10 MG tablet  Daily        10/24/22 0010              Suzzette Righter, PA-C 10/24/22 0018    Merryl Hacker, MD 10/24/22 [phone]

## 2022-10-23 NOTE — ED Provider Notes (Incomplete)
[facility] Provider Note   CSN: [id number] Arrival date & time: 10/23/22  2050     History {Add pertinent medical, surgical, social history, OB history to HPI:1} Chief Complaint  Patient presents with  . Shortness of Breath    Jacqueline [name] is a 22 y.o. female with PMH asthma, seasonal allergies, anxiety who presents to the ED complaining of an asthma exacerbation that started yesterday.  Patient reports that she frequently has asthma exacerbations during the spring.  She has recently had associated nasal congestion and believes that her allergies are acting up.  She denies known sick contacts.  She denies associated fever, chills, nausea with vomiting, diarrhea, chest pain.  Reports that she was using her albuterol inhaler at home but use it so much that she ran out and thus came to the ED for evaluation.      Home Medications Prior to Admission medications   Medication Sig Start Date End Date Taking? Authorizing Provider  albuterol (VENTOLIN HFA) 108 (90 Base) MCG/ACT inhaler Inhale 2 puffs into the lungs every 6 (six) hours as needed for wheezing or shortness of breath. 08/14/22   Icard, Leory Plowman L, DO  albuterol (VENTOLIN HFA) 108 (90 Base) MCG/ACT inhaler Inhale 2 puffs into the lungs every 4 (four) hours as needed for wheezing or shortness of breath. 10/09/22   Lajean Saver, MD  Budeson-Glycopyrrol-Formoterol (BREZTRI AEROSPHERE) 160-9-4.8 MCG/ACT AERO Inhale 2 puffs into the lungs in the morning and at bedtime. 02/14/22   Icard, Octavio Graves, DO  cetirizine (ZYRTEC) 10 MG tablet TAKE 1 TABLET BY MOUTH EVERY DAY 10/22/21   Padgett, Rae Halsted, MD  EPIPEN 2-PAK 0.3 MG/0.3ML SOAJ injection Inject 0.3 mg into the muscle as needed for anaphylaxis. 10/04/21   Kennith Gain, MD  fluticasone (FLONASE) 50 MCG/ACT nasal spray 2 sprays per nostril daily for 1-2 weeks at a time before stopping once nasal congestion improves. 10/04/21    Kennith Gain, MD  guanFACINE (INTUNIV) 1 MG TB24 ER tablet Take 1 tablet (1 mg total) by mouth at bedtime. 05/21/21   Armando Reichert, MD  hydrOXYzine (ATARAX/VISTARIL) 25 MG tablet Take 1 tablet (25 mg total) by mouth 3 (three) times daily as needed for anxiety. 09/01/18   Trude Mcburney, FNP  ipratropium-albuterol (DUONEB) 0.5-2.5 (3) MG/3ML SOLN Take it 2 times scheduled and every 8 hours as needed if needed for shortness of breath and wheezing. 09/16/21   Thurnell Lose, MD  mepolizumab (NUCALA) 100 MG injection Inject 100 mg into the skin every 28 (twenty-eight) days.    [provider]  olopatadine (PATANOL) 0.1 % ophthalmic solution Place 1 drop into both eyes 2 (two) times daily as needed for allergies (Itchy, watery eyes). 10/04/21   Kennith Gain, MD  polyethylene glycol (MIRALAX / GLYCOLAX) 17 g packet Take 17 g by mouth daily. 10/22/21   Horton, Barbette Hair, MD  predniSONE (DELTASONE) 20 MG tablet 3 tabs po day one, then 2 tabs daily x 4 days 09/29/22   Domenic Moras, PA-C  predniSONE (DELTASONE) 20 MG tablet Take 3 tablets (60 mg total) by mouth daily. 10/10/22   Lajean Saver, MD  VENTOLIN HFA 108 (90 Base) MCG/ACT inhaler INHALE 2 PUFFS BY MOUTH EVERY 4 HOURS AS NEEDED FOR WHEEZE OR FOR SHORTNESS OF BREATH 02/11/22   Padgett, Rae Halsted, MD  VYVANSE 60 MG capsule Take 1 capsule (60 mg total) by mouth every morning. 05/21/21   Armando Reichert, MD  Allergies    Bee venom, Egg-derived products, Other, Peanut-containing drug products, and Pollen extract    Review of Systems   Review of Systems  All other systems reviewed and are negative.   Physical Exam Updated Vital Signs BP 127/79 (BP Location: Left Arm)   Pulse 92   Temp 98.7 F (37.1 C) (Axillary)   Resp (!) 27   SpO2 99%  Physical Exam Vitals and nursing note reviewed.  Constitutional:      General: She is not in acute distress.    Appearance: Normal appearance. She is not  ill-appearing or toxic-appearing.  HENT:     Head: Normocephalic and atraumatic.     Mouth/Throat:     Mouth: Mucous membranes are moist.  Eyes:     Conjunctiva/sclera: Conjunctivae normal.  Cardiovascular:     Rate and Rhythm: Normal rate and regular rhythm.     Heart sounds: No murmur heard. Pulmonary:     Effort: Tachypnea (mild) present. No accessory muscle usage or respiratory distress.     Breath sounds: No stridor. Wheezing (diffuse inspiratory and expiratory, moderate) present. No rhonchi or rales.     Comments: Speaking in full sentences with ease without signs of respiratory distress Abdominal:     General: Abdomen is flat.     Palpations: Abdomen is soft.     Tenderness: There is no abdominal tenderness.  Musculoskeletal:        General: Normal range of motion.     Cervical back: Neck supple.     Right lower leg: No tenderness. No edema.     Left lower leg: No tenderness. No edema.  Skin:    General: Skin is warm and dry.     Capillary Refill: Capillary refill takes less than 2 seconds.  Neurological:     Mental Status: She is alert. Mental status is at baseline.  Psychiatric:        Mood and Affect: Mood normal.        Behavior: Behavior normal.     ED Results / Procedures / Treatments   Labs (all labs ordered are listed, but only abnormal results are displayed) Labs Reviewed  RESP PANEL BY RT-PCR (RSV, FLU A&B, COVID)  RVPGX2    EKG None  Radiology DG Chest 2 View  Result Date: 10/23/2022 CLINICAL DATA:  Shortness of breath.  Asthma EXAM: CHEST - 2 VIEW COMPARISON:  X-ray 10/09/2022 and older FINDINGS: The heart size and mediastinal contours are within normal limits. No consolidation, pneumothorax or effusion. No edema. The visualized skeletal structures are unremarkable. Air-fluid level along the stomach beneath the left hemidiaphragm. IMPRESSION: No acute cardiopulmonary disease. Electronically Signed   By: Jill Side M.D.   On: 10/23/2022 21:48     Procedures Procedures  {Document cardiac monitor, telemetry assessment procedure when appropriate:1}  Medications Ordered in ED Medications  albuterol (PROVENTIL) (2.5 MG/3ML) 0.083% nebulizer solution 2.5 mg (2.5 mg Nebulization Given 10/23/22 2115)  ipratropium-albuterol (DUONEB) 0.5-2.5 (3) MG/3ML nebulizer solution 3 mL (3 mLs Nebulization Given 10/23/22 2115)  dexamethasone (DECADRON) injection 10 mg (10 mg Intravenous Given 10/23/22 2254)  magnesium sulfate IVPB 2 g 50 mL (2 g Intravenous New Bag/Given 10/23/22 2254)  ipratropium-albuterol (DUONEB) 0.5-2.5 (3) MG/3ML nebulizer solution 3 mL (3 mLs Nebulization Given 10/23/22 2328)    ED Course/ Medical Decision Making/ A&P   {   Click here for ABCD2, HEART and other calculatorsREFRESH Note before signing :1}  Medical Decision Making Amount and/or Complexity of Data Reviewed Labs: ordered. Decision-making details documented in ED Course. Radiology: ordered. Decision-making details documented in ED Course.   Medical Decision Making:   Jacqueline [name] is a 22 y.o. female who presented to the ED today with shortness of breath detailed above.    Patient's presentation is complicated by their history of asthma, seasonal allergies.  Patient placed on continuous vitals and telemetry monitoring while in ED which was reviewed periodically.  Complete initial physical exam performed, notably the patient  was in acute distress.  She had moderate inspiratory and expiratory wheezing diffusely.  Mild tachypnea.  No nasal flaring, no retractions, speaking in full sentences with ease without signs of acute respiratory distress..    Reviewed and confirmed nursing documentation for past medical history, family history, social history.    Initial Assessment:   With the patient's presentation of ***, most likely diagnosis is ***. Differential diagnosis includes but is not limited to {crccopa:27899}  Initial Plan:  Screening  labs including CBC and Metabolic panel to evaluate for infectious or metabolic etiology of disease.  Urinalysis with reflex culture ordered to evaluate for UTI or relevant urologic/nephrologic pathology.  CXR to evaluate for structural/infectious intrathoracic pathology.  EKG to evaluate for cardiac pathology Objective evaluation as reviewed   Initial Study Results:   Laboratory  All laboratory results reviewed without evidence of clinically relevant pathology.   Exceptions include: ***   EKG EKG was reviewed independently. ST segments without concerns for elevations.   EKG: {ekg findings:315101}.   Radiology:  All images reviewed independently. Agree with radiology report at this time.   DG Chest 2 View  Result Date: 10/23/2022 CLINICAL DATA:  Shortness of breath.  Asthma EXAM: CHEST - 2 VIEW COMPARISON:  X-ray 10/09/2022 and older FINDINGS: The heart size and mediastinal contours are within normal limits. No consolidation, pneumothorax or effusion. No edema. The visualized skeletal structures are unremarkable. Air-fluid level along the stomach beneath the left hemidiaphragm. IMPRESSION: No acute cardiopulmonary disease. Electronically Signed   By: Jill Side M.D.   On: 10/23/2022 21:48   DG Chest 2 View  Result Date: 10/09/2022 CLINICAL DATA:  Shortness of breath. EXAM: CHEST - 2 VIEW COMPARISON:  Chest radiographs 09/16/2022 and 05/21/2022 FINDINGS: Cardiac silhouette and mediastinal contours are within normal limits. The lungs are clear. No pleural effusion or pneumothorax. No acute skeletal abnormality. IMPRESSION: No active cardiopulmonary disease. Electronically Signed   By: Yvonne Kendall M.D.   On: 10/09/2022 17:32      Consults: Case discussed with ***.   Final Assessment and Plan:   ***    Clinical Impression: No diagnosis found.   Data Unavailable    {Document critical care time when appropriate:1} {Document review of labs and clinical decision tools ie heart score,  Chads2Vasc2 etc:1}  {Document your independent review of radiology images, and any outside records:1} {Document your discussion with family members, caretakers, and with consultants:1} {Document social determinants of health affecting pt's care:1} {Document your decision making why or why not admission, treatments were needed:1} Final Clinical Impression(s) / ED Diagnoses Final diagnoses:  None    Rx / DC Orders ED Discharge Orders     None

## 2022-10-24 MED ORDER — ALBUTEROL SULFATE HFA 108 (90 BASE) MCG/ACT IN AERS: 1.0000 | INHALATION_SPRAY | Freq: Four times a day (QID) | RESPIRATORY_TRACT | 0 refills | Status: DC | PRN

## 2022-10-24 MED ORDER — ALBUTEROL (5 MG/ML) CONTINUOUS INHALATION SOLN
15.0000 mg/h | INHALATION_SOLUTION | Freq: Once | RESPIRATORY_TRACT | Status: AC
  Administered 2022-10-24: 15 mg/h via RESPIRATORY_TRACT
  Filled 2022-10-24: qty 20

## 2022-10-24 MED ORDER — PREDNISONE 10 MG PO TABS: 40.0000 mg | ORAL_TABLET | Freq: Every day | ORAL | 0 refills | Status: AC

## 2022-10-24 NOTE — Discharge Instructions (Addendum)
Thank you for letting us take care of you today.  Your viral swabs and chest x-ray were negative.  We gave you multiple breathing treatments, steroids, and magnesium to help with an asthma exacerbation.  Please follow-up with your asthma and allergy specialist next week as scheduled.  I prescribed a new albuterol inhaler for you to use at home since she ran out of your previous one.   I am also sending you home with 4 days of steroids to take to help with your asthma exacerbation. Please take these as prescribed.   If you develop any new or worsening symptoms such as recurrence of your shortness of breath, chest pain, fever, or other new, concerning symptoms, please return to nearest emergency department for re-evaluation.

## 2022-10-29 ENCOUNTER — Encounter (HOSPITAL_BASED_OUTPATIENT_CLINIC_OR_DEPARTMENT_OTHER): Payer: Self-pay | Admitting: Emergency Medicine

## 2022-10-29 ENCOUNTER — Emergency Department (HOSPITAL_BASED_OUTPATIENT_CLINIC_OR_DEPARTMENT_OTHER)
Admission: EM | Admit: 2022-10-29 | Discharge: 2022-10-29 | Disposition: A | Discharge status: Home / Self Care | Payer: Federal, State, Local not specified - PPO | Attending: Emergency Medicine | Admitting: Emergency Medicine

## 2022-10-29 ENCOUNTER — Emergency Department (HOSPITAL_BASED_OUTPATIENT_CLINIC_OR_DEPARTMENT_OTHER): Payer: Federal, State, Local not specified - PPO | Admitting: Radiology

## 2022-10-29 ENCOUNTER — Other Ambulatory Visit: Payer: Self-pay

## 2022-10-29 DIAGNOSIS — J45901 Unspecified asthma with (acute) exacerbation: Secondary | ICD-10-CM | POA: Diagnosis not present

## 2022-10-29 DIAGNOSIS — R062 Wheezing: Secondary | ICD-10-CM | POA: Diagnosis present

## 2022-10-29 DIAGNOSIS — Z7951 Long term (current) use of inhaled steroids: Secondary | ICD-10-CM | POA: Insufficient documentation

## 2022-10-29 DIAGNOSIS — Z91010 Allergy to peanuts: Secondary | ICD-10-CM | POA: Diagnosis not present

## 2022-10-29 MED ORDER — IPRATROPIUM-ALBUTEROL 0.5-2.5 (3) MG/3ML IN SOLN
RESPIRATORY_TRACT | Status: AC
  Filled 2022-10-29: qty 3

## 2022-10-29 MED ORDER — IPRATROPIUM-ALBUTEROL 0.5-2.5 (3) MG/3ML IN SOLN
3.0000 mL | Freq: Once | RESPIRATORY_TRACT | Status: AC
  Administered 2022-10-29: 3 mL via RESPIRATORY_TRACT

## 2022-10-29 MED ORDER — BREZTRI AEROSPHERE 160-9-4.8 MCG/ACT IN AERO: 2.0000 | INHALATION_SPRAY | Freq: Two times a day (BID) | RESPIRATORY_TRACT | 3 refills | Status: DC

## 2022-10-29 MED ORDER — CETIRIZINE HCL 10 MG PO TABS: 10.0000 mg | ORAL_TABLET | Freq: Every day | ORAL | 0 refills | Status: DC

## 2022-10-29 MED ORDER — ALBUTEROL SULFATE (2.5 MG/3ML) 0.083% IN NEBU
INHALATION_SOLUTION | RESPIRATORY_TRACT | Status: AC
  Filled 2022-10-29: qty 3

## 2022-10-29 MED ORDER — ALBUTEROL SULFATE (2.5 MG/3ML) 0.083% IN NEBU
2.5000 mg | INHALATION_SOLUTION | Freq: Once | RESPIRATORY_TRACT | Status: AC
  Administered 2022-10-29: 2.5 mg via RESPIRATORY_TRACT

## 2022-10-29 NOTE — ED Notes (Signed)
Patient with improvement following breathing treatment. Patient to XR with radiology staff

## 2022-10-29 NOTE — ED Triage Notes (Signed)
Asthma exacerbation on going Not relieved with neb albuterol or inhaler Audible wheezing in triage

## 2022-10-29 NOTE — Discharge Instructions (Signed)
You were seen in the emergency department today for an asthma exacerbation. You were given breathing treatments with improvement.  Continue to take your daily controller inhaler as prescribed as well as daily cetirizine.  Continue to use your albuterol every 4-6 hours as needed and continue to take any other asthma medication that you use.   Please make an appointment with your primary care doctor within one week to assure improvement or resolution in symptoms.  Follow-up with your asthma and allergy doctor tomorrow as planned.  Asthma can get worse after it gets better, sometimes suddenly. If your breathing worsens and does not improve with your inhaler, or you need to use your inhaler more than 2 times every 4 hours, you should return to the emergency department.

## 2022-10-29 NOTE — ED Provider Notes (Signed)
[place] EMERGENCY DEPARTMENT AT [facility] Provider Note   CSN: [id number] Arrival date & time: 10/29/22  1942     History {Add pertinent medical, surgical, social history, OB history to HPI:1} Chief Complaint  Patient presents with   Asthma    Jacqueline [name] is a 22 y.o. female.   Asthma       Home Medications Prior to Admission medications   Medication Sig Start Date End Date Taking? Authorizing Provider  albuterol (VENTOLIN HFA) 108 (90 Base) MCG/ACT inhaler Inhale 1-2 puffs into the lungs every 6 (six) hours as needed for wheezing or shortness of breath. 10/24/22 11/23/22  Gowens, Mariah L, PA-C  Budeson-Glycopyrrol-Formoterol (BREZTRI AEROSPHERE) 160-9-4.8 MCG/ACT AERO Inhale 2 puffs into the lungs in the morning and at bedtime. 02/14/22   Icard, Rachel Bo, DO  cetirizine (ZYRTEC) 10 MG tablet TAKE 1 TABLET BY MOUTH EVERY DAY 10/22/21   Padgett, Pilar Grammes, MD  EPIPEN 2-PAK 0.3 MG/0.3ML SOAJ injection Inject 0.3 mg into the muscle as needed for anaphylaxis. 10/04/21   Marcelyn Bruins, MD  fluticasone (FLONASE) 50 MCG/ACT nasal spray 2 sprays per nostril daily for 1-2 weeks at a time before stopping once nasal congestion improves. 10/04/21   Marcelyn Bruins, MD  guanFACINE (INTUNIV) 1 MG TB24 ER tablet Take 1 tablet (1 mg total) by mouth at bedtime. 05/21/21   Karsten Ro, MD  hydrOXYzine (ATARAX/VISTARIL) 25 MG tablet Take 1 tablet (25 mg total) by mouth 3 (three) times daily as needed for anxiety. 09/01/18   Verneda Skill, FNP  ipratropium-albuterol (DUONEB) 0.5-2.5 (3) MG/3ML SOLN Take it 2 times scheduled and every 8 hours as needed if needed for shortness of breath and wheezing. 09/16/21   Leroy Sea, MD  mepolizumab (NUCALA) 100 MG injection Inject 100 mg into the skin every 28 (twenty-eight) days.    [provider]  olopatadine (PATANOL) 0.1 % ophthalmic solution Place 1 drop into both eyes 2 (two) times daily as  needed for allergies (Itchy, watery eyes). 10/04/21   Marcelyn Bruins, MD  polyethylene glycol (MIRALAX / GLYCOLAX) 17 g packet Take 17 g by mouth daily. 10/22/21   Horton, Mayer Masker, MD  VYVANSE 60 MG capsule Take 1 capsule (60 mg total) by mouth every morning. 05/21/21   Karsten Ro, MD      Allergies    Bee venom, Egg-derived products, Other, Peanut-containing drug products, and Pollen extract    Review of Systems   Review of Systems  Physical Exam Updated Vital Signs BP (!) 133/93   Pulse 84   Temp 98.2 F (36.8 C) (Oral)   Resp (!) 22   LMP 10/28/2022   SpO2 95%  Physical Exam  ED Results / Procedures / Treatments   Labs (all labs ordered are listed, but only abnormal results are displayed) Labs Reviewed - No data to display  EKG None  Radiology DG Chest 2 View  Result Date: 10/29/2022 CLINICAL DATA:  Shortness of breath EXAM: CHEST - 2 VIEW COMPARISON:  10/23/2022 FINDINGS: The heart size and mediastinal contours are within normal limits. Both lungs are clear. The visualized skeletal structures are unremarkable. IMPRESSION: No active cardiopulmonary disease. Electronically Signed   By: Alcide Clever M.D.   On: 10/29/2022 20:33    Procedures Procedures  {Document cardiac monitor, telemetry assessment procedure when appropriate:1}  Medications Ordered in ED Medications  albuterol (PROVENTIL) (2.5 MG/3ML) 0.083% nebulizer solution 2.5 mg (2.5 mg Nebulization Given 10/29/22 2002)  ipratropium-albuterol (  DUONEB) 0.5-2.5 (3) MG/3ML nebulizer solution 3 mL (3 mLs Nebulization Given 10/29/22 2002)    ED Course/ Medical Decision Making/ A&P   {   Click here for ABCD2, HEART and other calculatorsREFRESH Note before signing :1}                          Medical Decision Making Amount and/or Complexity of Data Reviewed Radiology: ordered.  Risk Prescription drug management.   ***  {Document critical care time when appropriate:1} {Document review of labs and  clinical decision tools ie heart score, Chads2Vasc2 etc:1}  {Document your independent review of radiology images, and any outside records:1} {Document your discussion with family members, caretakers, and with consultants:1} {Document social determinants of health affecting pt's care:1} {Document your decision making why or why not admission, treatments were needed:1} Final Clinical Impression(s) / ED Diagnoses Final diagnoses:  None    Rx / DC Orders ED Discharge Orders     None

## 2022-10-29 NOTE — ED Notes (Signed)
Pt was roomed to OBS for neb tx. Pt was informed to return to lobby following tx. Pt verbalized understanding

## 2022-10-30 ENCOUNTER — Ambulatory Visit: Payer: Federal, State, Local not specified - PPO | Admitting: Allergy

## 2022-11-08 ENCOUNTER — Encounter: Payer: Self-pay | Admitting: Allergy

## 2022-11-08 ENCOUNTER — Ambulatory Visit (INDEPENDENT_AMBULATORY_CARE_PROVIDER_SITE_OTHER): Payer: Federal, State, Local not specified - PPO | Admitting: Allergy

## 2022-11-08 VITALS — BP 100/84 | HR 100 | Temp 98.1°F | Resp 18 | Ht 63.56 in | Wt 176.3 lb

## 2022-11-08 DIAGNOSIS — J302 Other seasonal allergic rhinitis: Secondary | ICD-10-CM

## 2022-11-08 DIAGNOSIS — J3089 Other allergic rhinitis: Secondary | ICD-10-CM | POA: Diagnosis not present

## 2022-11-08 DIAGNOSIS — T7800XD Anaphylactic reaction due to unspecified food, subsequent encounter: Secondary | ICD-10-CM

## 2022-11-08 DIAGNOSIS — H1013 Acute atopic conjunctivitis, bilateral: Secondary | ICD-10-CM | POA: Diagnosis not present

## 2022-11-08 DIAGNOSIS — J4550 Severe persistent asthma, uncomplicated: Secondary | ICD-10-CM | POA: Diagnosis not present

## 2022-11-08 MED ORDER — FLUTICASONE PROPIONATE 50 MCG/ACT NA SUSP: NASAL | 5 refills | Status: DC

## 2022-11-08 MED ORDER — EPIPEN 2-PAK 0.3 MG/0.3ML IJ SOAJ: INTRAMUSCULAR | 1 refills | Status: DC

## 2022-11-08 MED ORDER — BREZTRI AEROSPHERE 160-9-4.8 MCG/ACT IN AERO: 2.0000 | INHALATION_SPRAY | Freq: Two times a day (BID) | RESPIRATORY_TRACT | 5 refills | Status: DC

## 2022-11-08 MED ORDER — VENTOLIN HFA 108 (90 BASE) MCG/ACT IN AERS: 2.0000 | INHALATION_SPRAY | RESPIRATORY_TRACT | 1 refills | Status: DC | PRN

## 2022-11-08 MED ORDER — OLOPATADINE HCL 0.1 % OP SOLN: 1.0000 [drp] | Freq: Two times a day (BID) | OPHTHALMIC | 5 refills | Status: DC | PRN

## 2022-11-08 MED ORDER — ALBUTEROL SULFATE (2.5 MG/3ML) 0.083% IN NEBU: INHALATION_SOLUTION | RESPIRATORY_TRACT | 1 refills | Status: DC

## 2022-11-08 MED ORDER — TEZEPELUMAB-EKKO 210 MG/1.91ML ~~LOC~~ SOSY
210.0000 mg | PREFILLED_SYRINGE | SUBCUTANEOUS | Status: AC
  Administered 2022-11-08: 210 mg via SUBCUTANEOUS

## 2022-11-08 MED ORDER — CETIRIZINE HCL 10 MG PO TABS: ORAL_TABLET | ORAL | 5 refills | Status: DC

## 2022-11-08 NOTE — Progress Notes (Signed)
Follow-up Note  RE: Jacqueline [name] MRN: [id number] DOB: 10/22/2000 Date of Office Visit: 11/08/2022  History of present illness: Jacqueline [name] is a 22 y.o. female presenting today for follow-up of asthma, allergic rhinitis with conjunctivitis and food allergy.  She has not had an follow-up visit with me since 10/04/21. Since then she has had 8 ED visits for asthma flares.   At her last visit we discussed changing her asthma biologic injection from Nucala to Engelhard as the Nucala no longer seemed to be controlling her asthma as he had initially.  She received a sample of Tezspire in the office that day.  However the Anola Gurney was not approved by her insurance as she was not appropriately filling her asthma controller medications.  We are then able to get it approved as we were providing samples of Breztri from our office as she reports it was costing around $100.   She never came back to office to continue injections. She currently states her asthma is not controlled as she is out of all of her medications.  Per her last visit she is supposed to be on Breztri inhaler 2 puffs twice a day, albuterol as needed use, cetirizine daily, Flonase daily for congestion, needed, olopatadine as needed for itchy watery eyes.  Because she does not have any of these medications at this time she is having more cough, wheeze, shortness of breath, mucus production, congestion, itchy watery eyes, runny stuffy nose and sneezing.  The spring really impacts her allergies which are impacts her asthma. She feels like she was doing better as she states 8 ED visits and a year her asthma is much less than the amount of ED visits she has had in the past.  She also states she was doing better as she was getting back into the gym to workout but with the spring and her allergies she is noting it more difficult to develop this time. She states she has noted she has been able to tolerate egg whites.  She states her report  with the states included egg whites and [place] bacon.  She would like to keep her food allergy testing.  She does con have tinue to avoid tree nuts and peanuts.  We need to refill her epinephrine device at this time.  Review of systems: Review of Systems  Constitutional: Negative.   HENT:  Positive for congestion, postnasal drip and sneezing.   Eyes:  Positive for itching.  Respiratory:  Positive for cough and shortness of breath.   Cardiovascular: Negative.   Gastrointestinal: Negative.   Musculoskeletal: Negative.   Skin: Negative.   Allergic/Immunologic: Negative.   Neurological: Negative.      All other systems negative unless noted above in HPI  Past medical/social/surgical/family history have been reviewed and are unchanged unless specifically indicated below.  No changes  Medication List: Current Outpatient Medications  Medication Sig Dispense Refill   Budeson-Glycopyrrol-Formoterol (BREZTRI AEROSPHERE) 160-9-4.8 MCG/ACT AERO Inhale 2 puffs into the lungs in the morning and at bedtime. (Patient not taking: Reported on 11/08/2022) 10.7 g 3   cetirizine (ZYRTEC) 10 MG tablet Take 1 tablet (10 mg total) by mouth daily. (Patient not taking: Reported on 11/08/2022) 90 tablet 0   EPIPEN 2-PAK 0.3 MG/0.3ML SOAJ injection Inject 0.3 mg into the muscle as needed for anaphylaxis. (Patient not taking: Reported on 11/08/2022) 1 each 1   fluticasone (FLONASE) 50 MCG/ACT nasal spray 2 sprays per nostril daily for 1-2 weeks at a time  before stopping once nasal congestion improves. (Patient not taking: Reported on 11/08/2022) 1 g 5   hydrOXYzine (ATARAX/VISTARIL) 25 MG tablet Take 1 tablet (25 mg total) by mouth 3 (three) times daily as needed for anxiety. (Patient not taking: Reported on 11/08/2022) 30 tablet 1   ipratropium-albuterol (DUONEB) 0.5-2.5 (3) MG/3ML SOLN Take it 2 times scheduled and every 8 hours as needed if needed for shortness of breath and wheezing. (Patient not taking: Reported on  11/08/2022) 360 mL 0   olopatadine (PATANOL) 0.1 % ophthalmic solution Place 1 drop into both eyes 2 (two) times daily as needed for allergies (Itchy, watery eyes). (Patient not taking: Reported on 11/08/2022) 5 mL 5   Current Facility-Administered Medications  Medication Dose Route Frequency Provider Last Rate Last Admin   tezepelumab-ekko (TEZSPIRE) 210 MG/1. syringe 210 mg  210 mg Subcutaneous Q28 days Marcelyn Bruins, MD   210 mg at 11/08/22 1144     Known medication allergies: Allergies  Allergen Reactions   Bee Venom Shortness Of Breath   Egg-Derived Products Shortness Of Breath   Other Anaphylaxis and Other (See Comments)    Nuts and Tree nuts Pet dander cats and dogs   Peanut-Containing Drug Products Anaphylaxis   Pollen Extract Swelling     Physical examination: Blood pressure 100/84, pulse 100, temperature 98.1 F (36.7 C), temperature source Temporal, resp. rate 18, height 5' 3.56" (1.614 m), weight 176 lb 4.8 oz (80 kg), last menstrual period 10/28/2022, SpO2 92 %.  General: Alert, interactive, in no acute distress. HEENT: PERRLA, TMs pearly gray, turbinates moderately edematous without discharge, post-pharynx non erythematous. Neck: Supple without lymphadenopathy. Lungs: Decreased breath sounds bilaterally without wheezing, rhonchi or rales. {no increased work of breathing. CV: Normal S1, S2 without murmurs. Abdomen: Nondistended, nontender. Skin: Warm and dry, without lesions or rashes. Extremities:  No clubbing, cyanosis or edema. Neuro:   Grossly intact.  Diagnositics/Labs: DuoNeb given in office based on lung exam Spirometry: FEV1: 1.09L 39%, FVC: 1.62L 51%, ratio consistent with severe obstructive pattern  Assessment and plan:   Resume all your medications as below.  Refills have been sent in.  We have discussed the importance today of staying consistent with your medications and appointments.   We need to decrease your asthma flares so you  aren't going to the ED as much as have you been and thus reduce your prednisone usage.    Asthma - poorly controlled at this time Breztri 2 puffs twice a day with a spacer to prevent cough or wheeze  Continue albuterol 2 puffs every 4 hours as needed for cough or wheeze OR Instead use albuterol 0.083% solution via nebulizer one unit vial every 4 hours as needed for cough or wheeze You may use albuterol 2 puffs 5 to 15 minutes before activity to decrease cough or wheeze Tezspire monthly injections.  Sample provided today.   You need to make sure you talk with [name] about approval of this medication.  She will be calling from a  # and if you do not hear from her call our office so we can get you in touch.    Asthma control goals:  Full participation in all desired activities (may need albuterol before activity) Albuterol use two time or less a week on average (not counting use with activity) Cough interfering with sleep two time or less a month Oral steroids no more than once a year No hospitalizations  Allergic rhinitis Continue allergen avoidance measures directed toward pollen, pets, and  dust mites as listed below. Cetirizine 10 mg once a day  Flonase 2 sprays each nostril daily for 1-2 weeks at a time before stopping once nasal congestion improves for maximum benefit Perform nasal saline rinse with either distilled water or boil water and bring to room temperature.  Do not use water straight from the tap.    Allergic conjunctivitis Olopatadine 1 drop in each eye once a day as needed for red or itchy eyes  Food allergy Continue to avoid peanuts and tree nuts. In case of an allergic reaction, take Benadryl 50 mg every 4 hours, and if life-threatening symptoms occur, inject with EpiPen 0.3 mg.   Once asthma is under better control we can update your food and environmental allergy testing.    Follow up in 3 month or sooner if needed.     I appreciate the opportunity to take part  in [place] care. Please do not hesitate to contact me with questions.  Sincerely,   Margo Aye, MD Allergy/Immunology [facility]

## 2022-11-08 NOTE — Patient Instructions (Addendum)
Resume all your medications as below.  Refills have been sent in.  We have discussed the importance today of staying consistent with your medications and appointments.   We need to decrease your asthma flares so you aren't going to the ED as much as have you been and thus reduce your prednisone usage.    Asthma - poorly controlled at this time Breztri 2 puffs twice a day with a spacer to prevent cough or wheeze  Continue albuterol 2 puffs every 4 hours as needed for cough or wheeze OR Instead use albuterol 0.083% solution via nebulizer one unit vial every 4 hours as needed for cough or wheeze You may use albuterol 2 puffs 5 to 15 minutes before activity to decrease cough or wheeze Tezspire monthly injections.  Sample provided today.   You need to make sure you talk with [name] about approval of this medication.  She will be calling from a [place] # and if you do not hear from her call our office so we can get you in touch.    Asthma control goals:  Full participation in all desired activities (may need albuterol before activity) Albuterol use two time or less a week on average (not counting use with activity) Cough interfering with sleep two time or less a month Oral steroids no more than once a year No hospitalizations  Allergic rhinitis Continue allergen avoidance measures directed toward pollen, pets, and dust mites as listed below. Cetirizine 10 mg once a day  Flonase 2 sprays each nostril daily for 1-2 weeks at a time before stopping once nasal congestion improves for maximum benefit Perform nasal saline rinse with either distilled water or boil water and bring to room temperature.  Do not use water straight from the tap.    Allergic conjunctivitis Olopatadine 1 drop in each eye once a day as needed for red or itchy eyes  Food allergy Continue to avoid peanuts and tree nuts. In case of an allergic reaction, take Benadryl 50 mg every 4 hours, and if life-threatening symptoms occur,  inject with EpiPen 0.3 mg.   Once asthma is under better control we can update your food and environmental allergy testing.    Follow up in 3 month or sooner if needed.

## 2022-11-18 ENCOUNTER — Telehealth: Payer: Self-pay | Admitting: *Deleted

## 2022-11-18 NOTE — Telephone Encounter (Signed)
-----   Message from [facility] Larose Hires, MD sent at 11/08/2022  1:42 PM EDT ----- Jacqueline [name] a tezspire sample to get back going today.  She made promises to me that she will talk over the next 4 weeks

## 2022-11-18 NOTE — Telephone Encounter (Signed)
Since patient has not adhered to her control at least 1/2 the time in last six months her Ins will not approve same. Maybe we can try with her return visit to get her approval. It is very hard with this Ins especially if non compliant with her medications as they do monitor use

## 2022-11-19 NOTE — Telephone Encounter (Signed)
Per provider:  Do we usually have Tezspire samples in stock?  -------------------------------------------------------   Please inform pt of [name]'s response.  She will need to be compliant with her medications and get them filled appropriately as the insurance monitors these things to determine if an asthma injectable will get approved.   If she takes her medications as directed between now and her 3 months visit (please ensure she has it scheduled) then we should be able to get Tezpsire injections covered at that time.  If she is not compliant with her medication use and appointments we are not going to be able to help her with her asthma control.      Called patient - No DPR on file - unable to LMOVM per recording, voicemail has not been set up yet.

## 2022-11-21 NOTE — Telephone Encounter (Signed)
Called and spoke to patient and informed her of tammy note. Patient verbalized understanding.  In [place] we have one Sample of Jacqueline [name] currently.

## 2022-11-25 ENCOUNTER — Other Ambulatory Visit: Payer: Self-pay

## 2022-11-25 ENCOUNTER — Encounter (HOSPITAL_BASED_OUTPATIENT_CLINIC_OR_DEPARTMENT_OTHER): Payer: Self-pay | Admitting: Emergency Medicine

## 2022-11-25 ENCOUNTER — Emergency Department (HOSPITAL_BASED_OUTPATIENT_CLINIC_OR_DEPARTMENT_OTHER)
Admission: EM | Admit: 2022-11-25 | Discharge: 2022-11-25 | Disposition: A | Discharge status: Home / Self Care | Payer: Federal, State, Local not specified - PPO | Attending: Emergency Medicine | Admitting: Emergency Medicine

## 2022-11-25 DIAGNOSIS — Z7951 Long term (current) use of inhaled steroids: Secondary | ICD-10-CM | POA: Diagnosis not present

## 2022-11-25 DIAGNOSIS — Z91010 Allergy to peanuts: Secondary | ICD-10-CM | POA: Diagnosis not present

## 2022-11-25 DIAGNOSIS — Z113 Encounter for screening for infections with a predominantly sexual mode of transmission: Secondary | ICD-10-CM | POA: Insufficient documentation

## 2022-11-25 DIAGNOSIS — R062 Wheezing: Secondary | ICD-10-CM | POA: Diagnosis present

## 2022-11-25 DIAGNOSIS — R000 Tachycardia, unspecified: Secondary | ICD-10-CM | POA: Diagnosis not present

## 2022-11-25 DIAGNOSIS — J45909 Unspecified asthma, uncomplicated: Secondary | ICD-10-CM | POA: Diagnosis not present

## 2022-11-25 LAB — WET PREP, GENITAL
Clue Cells Wet Prep HPF POC: NONE SEEN
Sperm: NONE SEEN
Trich, Wet Prep: NONE SEEN
WBC, Wet Prep HPF POC: <10 (ref <10)
Yeast Wet Prep HPF POC: NONE SEEN

## 2022-11-25 MED ORDER — ALBUTEROL SULFATE HFA 108 (90 BASE) MCG/ACT IN AERS
2.0000 | INHALATION_SPRAY | RESPIRATORY_TRACT | Status: DC | PRN
  Administered 2022-11-25: 2 via RESPIRATORY_TRACT
  Filled 2022-11-25: qty 6.7

## 2022-11-25 MED ORDER — DEXAMETHASONE SODIUM PHOSPHATE 10 MG/ML IJ SOLN
10.0000 mg | Freq: Once | INTRAMUSCULAR | Status: AC
  Administered 2022-11-25: 10 mg via INTRAMUSCULAR
  Filled 2022-11-25: qty 1

## 2022-11-25 MED ORDER — ALBUTEROL SULFATE (2.5 MG/3ML) 0.083% IN NEBU
2.5000 mg | INHALATION_SOLUTION | Freq: Once | RESPIRATORY_TRACT | Status: AC
  Administered 2022-11-25: 2.5 mg via RESPIRATORY_TRACT
  Filled 2022-11-25: qty 3

## 2022-11-25 MED ORDER — ALBUTEROL SULFATE (2.5 MG/3ML) 0.083% IN NEBU: 5.0000 mg | INHALATION_SOLUTION | Freq: Once | RESPIRATORY_TRACT | Status: DC

## 2022-11-25 NOTE — ED Provider Notes (Signed)
[place] EMERGENCY DEPARTMENT AT [facility] Provider Note   CSN: [id number] Arrival date & time: 11/25/22  1558     History  Chief Complaint  Patient presents with   Wheezing    Jacqueline [name] is a 22 y.o. female.  Patient with history of poorly controlled asthma presents to the emergency department today for wheezing and shortness of breath.  She has had symptoms for about 3 days.  She [place] and thinks that the pollen made her symptoms worse.  Home medications are not helping very much.  She denies associated URI symptoms.  Denies chest pain or chest tightness.  Denies fevers.  She last saw her asthma clinic about 2 weeks ago.  They told her that she was taking too many prednisone treatments.  She thinks that the last time she was on prednisone was about 3 weeks ago.  Patient also requesting STI testing due to new sexual contact.  She is asymptomatic.       Home Medications Prior to Admission medications   Medication Sig Start Date End Date Taking? Authorizing Provider  albuterol (PROVENTIL) (2.5 MG/3ML) 0.083% nebulizer solution Use one vial in the nebulizer every 4-6 hours if needed for cough or wheeze. 11/08/22   Marcelyn Bruins, MD  Budeson-Glycopyrrol-Formoterol (BREZTRI AEROSPHERE) 160-9-4.8 MCG/ACT AERO Inhale 2 puffs into the lungs in the morning and at bedtime. 11/08/22   Marcelyn Bruins, MD  cetirizine (ZYRTEC) 10 MG tablet Take one tablet once daily 11/08/22   Marcelyn Bruins, MD  EPIPEN 2-PAK 0.3 MG/0.3ML SOAJ injection Use as directed for life threatening allergic reactions 11/08/22   Marcelyn Bruins, MD  fluticasone [facility]) 50 MCG/ACT nasal spray 2 sprays per nostril daily for 1-2 weeks at a time before stopping once nasal congestion improves. 11/08/22   Marcelyn Bruins, MD  hydrOXYzine (ATARAX/VISTARIL) 25 MG tablet Take 1 tablet (25 mg total) by mouth 3 (three) times  daily as needed for anxiety. Patient not taking: Reported on 11/08/2022 09/01/18   Alfonso Ramus T, FNP  ipratropium-albuterol (DUONEB) 0.5-2.5 (3) MG/3ML SOLN Take it 2 times scheduled and every 8 hours as needed if needed for shortness of breath and wheezing. Patient not taking: Reported on 11/08/2022 09/16/21   Leroy Sea, MD  olopatadine (PATANOL) 0.1 % ophthalmic solution Place 1 drop into both eyes 2 (two) times daily as needed for allergies (Itchy, watery eyes). 11/08/22   Marcelyn Bruins, MD  VENTOLIN HFA 108 [phone] Base) MCG/ACT inhaler Inhale 2 puffs into the lungs every 4 (four) hours as needed for wheezing or shortness of breath. 11/08/22   Marcelyn Bruins, MD      Allergies    Bee venom, Egg-derived products, Other, Peanut-containing drug products, and Pollen extract    Review of Systems   Review of Systems  Physical Exam Updated Vital Signs BP 124/64 (BP Location: Left Arm)   Pulse (!) 128   Temp (!) 97.4 F (36.3 C)   Resp (!) 22   Wt 77.1 kg   LMP 10/28/2022   SpO2 95%   BMI 29.59 kg/m  Physical Exam Vitals and nursing note reviewed.  Constitutional:      General: She is not in acute distress.    Appearance: She is well-developed.  HENT:     Head: Normocephalic and atraumatic.     Right Ear: External ear normal.     Left Ear: External ear normal.  Nose: Nose normal.  Eyes:     Conjunctiva/sclera: Conjunctivae normal.  Cardiovascular:     Rate and Rhythm: Regular rhythm. Tachycardia present.     Heart sounds: No murmur heard. Pulmonary:     Effort: No respiratory distress.     Breath sounds: Wheezing present. No rhonchi or rales.     Comments: Expiratory wheezing throughout all lung fields, no accessory muscle use or increased work of breathing noted. Abdominal:     Palpations: Abdomen is soft.     Tenderness: There is no abdominal tenderness. There is no guarding or rebound.  Musculoskeletal:     Cervical back: Normal range of  motion and neck supple.     Right lower leg: No edema.     Left lower leg: No edema.  Skin:    General: Skin is warm and dry.     Findings: No rash.  Neurological:     General: No focal deficit present.     Mental Status: She is alert. Mental status is at baseline.     Motor: No weakness.  Psychiatric:        Mood and Affect: Mood normal.     ED Results / Procedures / Treatments   Labs (all labs ordered are listed, but only abnormal results are displayed) Labs Reviewed  WET PREP, GENITAL  GC/CHLAMYDIA PROBE AMP ([place]) NOT AT [facility]    EKG EKG Interpretation  Date/Time:  Monday Nov 25 2022 16:06:31 EDT Ventricular Rate:  115 PR Interval:  120 QRS Duration: 78 QT Interval:  334 QTC Calculation: 462 R Axis:   58 Text Interpretation: Sinus tachycardia with frequent Premature ventricular complexes Right atrial enlargement Borderline ECG When compared with ECG of 23-Oct-2022 21:02, Premature ventricular complexes are now Present Minimal criteria for Anterior infarct are no longer Present Confirmed by Linwood Dibbles [phone]) on 11/25/2022 4:16:42 PM  Radiology No results found.  Procedures Procedures    Medications Ordered in ED Medications  dexamethasone (DECADRON) injection 10 mg (has no administration in time range)  albuterol (VENTOLIN HFA) 108 (90 Base) MCG/ACT inhaler 2 puff (has no administration in time range)  albuterol (PROVENTIL) (2.5 MG/3ML) 0.083% nebulizer solution 2.5 mg (2.5 mg Nebulization Given 11/25/22 1612)    ED Course/ Medical Decision Making/ A&P    Patient seen and examined. History obtained directly from patient.  I also reviewed most recent allergy outpatient visit note.  Labs/EKG: Ordered wet prep, GC and chlamydia.  Will self swab.  Imaging: None ordered  Medications/Fluids: Ordered: Albuterol treatment initiated by RT.  Will give IM Decadron per patient request to avoid prednisone.   Most recent vital signs reviewed and are as follows: BP  124/64 (BP Location: Left Arm)   Pulse (!) 128   Temp (!) 97.4 F (36.3 C)   Resp (!) 22   Wt 77.1 kg   LMP 10/28/2022   SpO2 95%   BMI 29.59 kg/m   Initial impression: Asthma exacerbation, encounter for STI testing.  6:10 PM Reassessment performed. Patient appears stable.  States that she is feeling better.  Lungs are now clear on exam without wheezing.  Labs personally reviewed and interpreted including: Wet prep agree negative  Reviewed pertinent lab work and imaging with patient at bedside. Questions answered.   Most current vital signs reviewed and are as follows: BP 132/86   Pulse 98   Temp (!) 97.4 F (36.3 C)   Resp 18   Wt 77.1 kg   LMP 10/28/2022  SpO2 94%   BMI 29.59 kg/m   Plan: Discharge to home.   Prescriptions written for: None, albuterol inhaler provided for home  Other home care instructions discussed: Every 4 hour albuterol treatment, avoidance of triggers  ED return instructions discussed: Worsening shortness of breath, increased work of breathing, fever, cough, new symptoms or other concerns  Follow-up instructions discussed: Patient encouraged to follow-up with their PCP in 5 days.                               Medical Decision Making Amount and/or Complexity of Data Reviewed Labs: ordered.  Risk Prescription drug management.   Patient with poorly controlled asthma, presents for exacerbation.  Also requesting STI testing.  Wet prep negative.  No abdominal pain to suggest PID.  Patient improved with treatment in the ED.  She does not want oral prednisone, patient given IM Decadron.  Low concern for pneumonia, PE.         Final Clinical Impression(s) / ED Diagnoses Final diagnoses:  Wheezing  Routine screening for STI (sexually transmitted infection)    Rx / DC Orders ED Discharge Orders     None         Renne Crigler, PA-C 11/25/22 1811    Linwood Dibbles, MD 11/25/22 2332

## 2022-11-25 NOTE — Discharge Instructions (Signed)
Please read and follow all provided instructions.  Your diagnoses today include:  1. Wheezing   2. Routine screening for STI (sexually transmitted infection)    Tests performed today include: Wet prep: no vaginal infection Gonorrhea/chlamydia testing pending Vital signs. See below for your results today.   Medications prescribed:  Albuterol inhaler - medication that opens up your airway  Use inhaler as follows: 1-2 puffs with spacer every 4 hours as needed for wheezing, cough, or shortness of breath.   Take any prescribed medications only as directed.  Home care instructions:  Follow any educational materials contained in this packet.  Follow-up instructions: Please follow-up with your primary care provider in the next 5 days for further evaluation of your symptoms and management of your asthma.  Return instructions:  Please return to the Emergency Department if you experience worsening symptoms. Please return with worsening wheezing, shortness of breath, or difficulty breathing. Return with persistent fever above 101F.  Please return if you have any other emergent concerns.  Additional Information:  Your vital signs today were: BP 132/86   Pulse 98   Temp (!) 97.4 F (36.3 C)   Resp 18   Wt 77.1 kg   LMP 10/28/2022   SpO2 94%   BMI 29.59 kg/m  If your blood pressure (BP) was elevated above 135/85 this visit, please have this repeated by your doctor within one month. --------------

## 2022-11-25 NOTE — ED Triage Notes (Signed)
Pt arrives pov, steady gait with c/o wheezing r/t asthma exacerbation not relieved with nebs or inhaler x 3 days. Pt also requests STD testing, denies symptoms

## 2022-11-26 LAB — GC/CHLAMYDIA PROBE AMP (~~LOC~~) NOT AT ARMC
Chlamydia: NEGATIVE
Comment: NEGATIVE
Comment: NORMAL
Neisseria Gonorrhea: NEGATIVE

## 2022-12-16 ENCOUNTER — Emergency Department (HOSPITAL_BASED_OUTPATIENT_CLINIC_OR_DEPARTMENT_OTHER)
Admission: EM | Admit: 2022-12-16 | Discharge: 2022-12-16 | Disposition: A | Discharge status: Home / Self Care | Payer: Federal, State, Local not specified - PPO | Attending: Emergency Medicine | Admitting: Emergency Medicine

## 2022-12-16 ENCOUNTER — Encounter (HOSPITAL_BASED_OUTPATIENT_CLINIC_OR_DEPARTMENT_OTHER): Payer: Self-pay

## 2022-12-16 ENCOUNTER — Other Ambulatory Visit: Payer: Self-pay

## 2022-12-16 ENCOUNTER — Emergency Department (HOSPITAL_BASED_OUTPATIENT_CLINIC_OR_DEPARTMENT_OTHER): Payer: Federal, State, Local not specified - PPO | Admitting: Radiology

## 2022-12-16 DIAGNOSIS — Z91010 Allergy to peanuts: Secondary | ICD-10-CM | POA: Insufficient documentation

## 2022-12-16 DIAGNOSIS — J4541 Moderate persistent asthma with (acute) exacerbation: Secondary | ICD-10-CM

## 2022-12-16 DIAGNOSIS — R062 Wheezing: Secondary | ICD-10-CM | POA: Diagnosis present

## 2022-12-16 MED ORDER — ALBUTEROL SULFATE HFA 108 (90 BASE) MCG/ACT IN AERS: 1.0000 | INHALATION_SPRAY | RESPIRATORY_TRACT | 0 refills | Status: DC | PRN

## 2022-12-16 MED ORDER — DEXAMETHASONE SODIUM PHOSPHATE 10 MG/ML IJ SOLN
10.0000 mg | Freq: Once | INTRAMUSCULAR | Status: AC
  Administered 2022-12-16: 10 mg via INTRAMUSCULAR
  Filled 2022-12-16: qty 1

## 2022-12-16 MED ORDER — ALBUTEROL (5 MG/ML) CONTINUOUS INHALATION SOLN
15.0000 mg/h | INHALATION_SOLUTION | Freq: Once | RESPIRATORY_TRACT | Status: AC
  Administered 2022-12-16: 15 mg/h via RESPIRATORY_TRACT
  Filled 2022-12-16: qty 20

## 2022-12-16 MED ORDER — PREDNISONE 10 MG PO TABS: 60.0000 mg | ORAL_TABLET | Freq: Every day | ORAL | 0 refills | Status: AC

## 2022-12-16 MED ORDER — ALBUTEROL SULFATE HFA 108 (90 BASE) MCG/ACT IN AERS
2.0000 | INHALATION_SPRAY | Freq: Once | RESPIRATORY_TRACT | Status: AC
  Administered 2022-12-16: 2 via RESPIRATORY_TRACT
  Filled 2022-12-16: qty 6.7

## 2022-12-16 NOTE — ED Notes (Signed)
Patient states she does not have an inhaler at home. She has been taking nebs daily with little relief of symptoms.

## 2022-12-16 NOTE — ED Notes (Signed)
RN reviewed discharge instructions with pt. Pt verbalized understanding and had no further questions. VSS upon discharge.  

## 2022-12-16 NOTE — Discharge Instructions (Addendum)
Your chest x-ray was normal.  We gave you a continuous nebulizer treatment and a dose of steroids to help with your asthma exacerbation.  Please follow-up with your asthma doctor as soon as possible.  If unable to follow-up with her, follow-up with your PCP.  If you do not have a PCP I provided 2 clinics that you may contact to schedule an appointment.  We provided you with an albuterol inhaler to take home.  Use this as needed.  I am also starting you on prednisone for home to help with your asthma exacerbation.  For any new or worsening symptoms, please return to the nearest ED for reevaluation.

## 2022-12-16 NOTE — ED Triage Notes (Signed)
Patient thinks she has a sinus infection, c/o mucus in chest and headache. Symptoms onset Friday.

## 2022-12-16 NOTE — ED Provider Notes (Signed)
[place] EMERGENCY DEPARTMENT AT [facility] Provider Note   CSN: [id number] Arrival date & time: 12/16/22  1856     History  Chief Complaint  Patient presents with   Sinus Problem    Jacqueline [name] is a 22 y.o. female with past medical history asthma who presents to the ED complaining of wheezing and shortness of breath for the last 3 days.  She states that symptoms feel similar to asthma exacerbation.  Her last exacerbation was earlier this month. She was recently out of town and unable to obtain albuterol inhaler due to prescription error. She is scheduled to see her asthma doctor again next month. No associated fever, chills, chest pain, abdominal pain, nausea, vomiting, cough, congestion, or other complaints. No known sick contacts.       Home Medications Prior to Admission medications   Medication Sig Start Date End Date Taking? Authorizing Provider  albuterol (VENTOLIN HFA) 108 (90 Base) MCG/ACT inhaler Inhale 1-2 puffs into the lungs every 4 (four) hours as needed for wheezing or shortness of breath. 12/16/22 01/15/23 Yes Danyela Posas L, PA-C  predniSONE (DELTASONE) 10 MG tablet Take 6 tablets (60 mg total) by mouth daily for 5 days. 12/16/22 12/21/22 Yes Dmitriy Gair L, PA-C  Budeson-Glycopyrrol-Formoterol (BREZTRI AEROSPHERE) 160-9-4.8 MCG/ACT AERO Inhale 2 puffs into the lungs in the morning and at bedtime. 11/08/22   Marcelyn Bruins, MD  cetirizine (ZYRTEC) 10 MG tablet Take one tablet once daily 11/08/22   Marcelyn Bruins, MD  EPIPEN 2-PAK 0.3 MG/0.3ML SOAJ injection Use as directed for life threatening allergic reactions 11/08/22   Marcelyn Bruins, MD  fluticasone [facility]) 50 MCG/ACT nasal spray 2 sprays per nostril daily for 1-2 weeks at a time before stopping once nasal congestion improves. 11/08/22   Marcelyn Bruins, MD  hydrOXYzine (ATARAX/VISTARIL) 25 MG tablet Take 1 tablet (25 mg total) by mouth 3 (three) times  daily as needed for anxiety. Patient not taking: Reported on 11/08/2022 09/01/18   Alfonso Ramus T, FNP  ipratropium-albuterol (DUONEB) 0.5-2.5 (3) MG/3ML SOLN Take it 2 times scheduled and every 8 hours as needed if needed for shortness of breath and wheezing. Patient not taking: Reported on 11/08/2022 09/16/21   Leroy Sea, MD  olopatadine (PATANOL) 0.1 % ophthalmic solution Place 1 drop into both eyes 2 (two) times daily as needed for allergies (Itchy, watery eyes). 11/08/22   Marcelyn Bruins, MD      Allergies    Bee venom, Egg-derived products, Other, Peanut-containing drug products, and Pollen extract    Review of Systems   Review of Systems  All other systems reviewed and are negative.   Physical Exam Updated Vital Signs BP (!) 140/94   Pulse 100   Temp 98.4 F (36.9 C)   Resp (!) 22   Ht 5\' 2"  (1.575 m)   Wt 79.4 kg   LMP 12/05/2022   SpO2 93%   BMI 32.01 kg/m  Physical Exam Vitals and nursing note reviewed.  Constitutional:      General: She is not in acute distress.    Appearance: Normal appearance. She is not ill-appearing, toxic-appearing or diaphoretic.  HENT:     Head: Normocephalic and atraumatic.     Mouth/Throat:     Mouth: Mucous membranes are moist.  Eyes:     General: No scleral icterus.    Conjunctiva/sclera: Conjunctivae normal.  Cardiovascular:     Rate and Rhythm: Normal rate and regular rhythm.  Heart sounds: No murmur heard. Pulmonary:     Effort: Pulmonary effort is normal. No respiratory distress.     Breath sounds: No stridor. Wheezing (diffuse worse on expiration) present. No rhonchi or rales.  Abdominal:     General: Abdomen is flat. There is no distension.     Palpations: Abdomen is soft.     Tenderness: There is no abdominal tenderness. There is no guarding or rebound.  Musculoskeletal:        General: Normal range of motion.     Cervical back: Normal range of motion and neck supple. No rigidity.     Right lower  leg: No edema.     Left lower leg: No edema.  Lymphadenopathy:     Cervical: No cervical adenopathy.  Skin:    General: Skin is warm and dry.     Capillary Refill: Capillary refill takes less than 2 seconds.     Coloration: Skin is not jaundiced or pale.     Findings: No rash.  Neurological:     Mental Status: She is alert. Mental status is at baseline.  Psychiatric:        Behavior: Behavior normal.     ED Results / Procedures / Treatments   Labs (all labs ordered are listed, but only abnormal results are displayed) Labs Reviewed - No data to display  EKG None  Radiology DG Chest 2 View  Result Date: 12/16/2022 CLINICAL DATA:  Headache and chest mucous. EXAM: CHEST - 2 VIEW COMPARISON:  October 29, 2022 FINDINGS: The heart size and mediastinal contours are within normal limits. Both lungs are clear. The visualized skeletal structures are unremarkable. IMPRESSION: No active cardiopulmonary disease. Electronically Signed   By: Aram Candela M.D.   On: 12/16/2022 20:19    Procedures Procedures    Medications Ordered in ED Medications  albuterol (VENTOLIN HFA) 108 (90 Base) MCG/ACT inhaler 2 puff (2 puffs Inhalation Given 12/16/22 1909)  dexamethasone (DECADRON) injection 10 mg (10 mg Intramuscular Given 12/16/22 2130)  albuterol (PROVENTIL,VENTOLIN) solution continuous neb (15 mg/hr Nebulization Given 12/16/22 2133)    ED Course/ Medical Decision Making/ A&P                             Medical Decision Making Amount and/or Complexity of Data Reviewed Radiology: ordered. Decision-making details documented in ED Course.  Risk Prescription drug management.   Medical Decision Making:   Jacqueline [name] is a 22 y.o. female who presented to the ED today with dyspnea detailed above.    Patient's presentation is complicated by their history of asthma.  Complete initial physical exam performed, notably the patient was in NAD. Diffuse expiratory wheezing. No signs  respiratory distress. RRR. Neurologically intact.    Reviewed and confirmed nursing documentation for past medical history, family history, social history.    Initial Assessment:   With the patient's presentation, differential diagnosis includes but is not limited to  Cardiac (AHF, pericardial effusion and tamponade, arrhythmias, ischemia, etc) Respiratory (COPD, asthma, pneumonia, pneumothorax, primary pulmonary hypertension, PE/VQ mismatch) Hematological (anemia) Neuromuscular (ALS, Guillain-Barr, etc)  This is most consistent with an acute complicated illness  Initial Plan:  CXR to evaluate for structural/infectious intrathoracic pathology.  Symptomatic treatment Objective evaluation as below reviewed   Initial Study Results:   Radiology:  All images reviewed independently. Agree with radiology report at this time.   DG Chest 2 View  Result Date: 12/16/2022 CLINICAL DATA:  Headache  and chest mucous. EXAM: CHEST - 2 VIEW COMPARISON:  October 29, 2022 FINDINGS: The heart size and mediastinal contours are within normal limits. Both lungs are clear. The visualized skeletal structures are unremarkable. IMPRESSION: No active cardiopulmonary disease. Electronically Signed   By: Aram Candela M.D.   On: 12/16/2022 20:19      Final Assessment and Plan:   22 year old female presents to the ED complaining of shortness of breath for the last 3 days.  History of asthma and symptoms feel similar to asthma exacerbation.  She has diffuse wheezing on exam that is worse with expiration.  No tachypnea, no tachycardia, no muscle retractions, no stridor, no signs of respiratory distress.  She has been unable to use her albuterol inhaler at home as there was a prescription error.  No fever or associated infectious symptoms.  No chest pain.  Patient nontoxic-appearing.  Maintaining oxygen saturation on room air without signs of difficulty.  Chest x-ray negative.  Patient afebrile.  Patient given continuous  nebulizer treatment and dose of Decadron with near resolution of wheezing.  Reports subjective relief of symptoms as well.  Will discharge her home on 5-day course of steroids and have her closely follow-up with her allergy doctor.  Albuterol inhaler provided in ED.  Patient expressed understanding of plan including strict ED return precautions.  All questions answered and stable for discharge.   Clinical Impression:  1. Moderate persistent asthma with exacerbation      Discharge            Final Clinical Impression(s) / ED Diagnoses Final diagnoses:  Moderate persistent asthma with exacerbation    Rx / DC Orders ED Discharge Orders          Ordered    predniSONE (DELTASONE) 10 MG tablet  Daily        12/16/22 2314    albuterol (VENTOLIN HFA) 108 (90 Base) MCG/ACT inhaler  Every 4 hours PRN        12/16/22 2314              Tonette Lederer, PA-C 12/16/22 2322    Tanda Rockers A, DO 12/16/22 2353

## 2023-01-14 ENCOUNTER — Emergency Department (HOSPITAL_BASED_OUTPATIENT_CLINIC_OR_DEPARTMENT_OTHER)
Admission: EM | Admit: 2023-01-14 | Discharge: 2023-01-14 | Disposition: A | Discharge status: Home / Self Care | Payer: Federal, State, Local not specified - PPO | Attending: Emergency Medicine | Admitting: Emergency Medicine

## 2023-01-14 ENCOUNTER — Encounter (HOSPITAL_BASED_OUTPATIENT_CLINIC_OR_DEPARTMENT_OTHER): Payer: Self-pay | Admitting: Respiratory Therapy

## 2023-01-14 ENCOUNTER — Other Ambulatory Visit: Payer: Self-pay

## 2023-01-14 ENCOUNTER — Emergency Department (HOSPITAL_BASED_OUTPATIENT_CLINIC_OR_DEPARTMENT_OTHER): Payer: Federal, State, Local not specified - PPO | Admitting: Radiology

## 2023-01-14 DIAGNOSIS — J069 Acute upper respiratory infection, unspecified: Secondary | ICD-10-CM | POA: Insufficient documentation

## 2023-01-14 DIAGNOSIS — R059 Cough, unspecified: Secondary | ICD-10-CM | POA: Diagnosis present

## 2023-01-14 DIAGNOSIS — Z1152 Encounter for screening for COVID-19: Secondary | ICD-10-CM | POA: Diagnosis not present

## 2023-01-14 DIAGNOSIS — J4521 Mild intermittent asthma with (acute) exacerbation: Secondary | ICD-10-CM | POA: Diagnosis not present

## 2023-01-14 DIAGNOSIS — Z91010 Allergy to peanuts: Secondary | ICD-10-CM | POA: Diagnosis not present

## 2023-01-14 LAB — RESP PANEL BY RT-PCR (RSV, FLU A&B, COVID)  RVPGX2
Influenza A by PCR: NEGATIVE
Influenza B by PCR: NEGATIVE
Resp Syncytial Virus by PCR: NEGATIVE
SARS Coronavirus 2 by RT PCR: NEGATIVE

## 2023-01-14 MED ORDER — PREDNISONE 50 MG PO TABS
60.0000 mg | ORAL_TABLET | Freq: Once | ORAL | Status: AC
  Administered 2023-01-14: 60 mg via ORAL
  Filled 2023-01-14: qty 1

## 2023-01-14 MED ORDER — ALBUTEROL SULFATE HFA 108 (90 BASE) MCG/ACT IN AERS: 2.0000 | INHALATION_SPRAY | RESPIRATORY_TRACT | 0 refills | Status: DC | PRN

## 2023-01-14 MED ORDER — IPRATROPIUM-ALBUTEROL 0.5-2.5 (3) MG/3ML IN SOLN
3.0000 mL | RESPIRATORY_TRACT | Status: AC
  Administered 2023-01-14: 3 mL via RESPIRATORY_TRACT
  Filled 2023-01-14: qty 3

## 2023-01-14 MED ORDER — ALBUTEROL SULFATE (2.5 MG/3ML) 0.083% IN NEBU
5.0000 mg | INHALATION_SOLUTION | RESPIRATORY_TRACT | Status: AC
  Administered 2023-01-14 (×3): 5 mg via RESPIRATORY_TRACT
  Filled 2023-01-14 (×3): qty 6

## 2023-01-14 MED ORDER — PREDNISONE 20 MG PO TABS: 40.0000 mg | ORAL_TABLET | Freq: Every day | ORAL | 0 refills | Status: AC

## 2023-01-14 NOTE — ED Provider Notes (Signed)
Jacqueline [name]   CSN: [id number] Arrival date & time: 01/14/23  1215     History  Chief Complaint  Patient presents with   Shortness of Breath    Jacqueline [name] is a 22 y.o. female.  HPI     SUBJECTIVE:  Jacqueline [name] is a 22 y.o. female who complains of congestion, post nasal drip, headches and dry cough for 2 days. She denies a history of fevers and vomiting and has a history of asthma.      Home Medications Prior to Admission medications   Medication Sig Start Date End Date Taking? Authorizing Provider  albuterol (VENTOLIN HFA) 108 (90 Base) MCG/ACT inhaler Inhale 2 puffs into the lungs every 4 (four) hours as needed for wheezing or shortness of breath. 01/14/23 02/13/23 Yes Jaquavius Hudler, MD  predniSONE (DELTASONE) 20 MG tablet Take 2 tablets (40 mg total) by mouth daily for 5 days. 01/14/23 01/19/23 Yes Derwood Kaplan, MD  Budeson-Glycopyrrol-Formoterol (BREZTRI AEROSPHERE) 160-9-4.8 MCG/ACT AERO Inhale 2 puffs into the lungs in the morning and at bedtime. 11/08/22   Marcelyn Bruins, MD  cetirizine (ZYRTEC) 10 MG tablet Take one tablet once daily 11/08/22   Marcelyn Bruins, MD  EPIPEN 2-PAK 0.3 MG/0.3ML SOAJ injection Use as directed for life threatening allergic reactions 11/08/22   Marcelyn Bruins, MD  fluticasone [facility]) 50 MCG/ACT nasal spray 2 sprays per nostril daily for 1-2 weeks at a time before stopping once nasal congestion improves. 11/08/22   Marcelyn Bruins, MD  hydrOXYzine (ATARAX/VISTARIL) 25 MG tablet Take 1 tablet (25 mg total) by mouth 3 (three) times daily as needed for anxiety. Patient not taking: Reported on 11/08/2022 09/01/18   Alfonso Ramus T, FNP  ipratropium-albuterol (DUONEB) 0.5-2.5 (3) MG/3ML SOLN Take it 2 times scheduled and every 8 hours as needed if needed for shortness of breath and wheezing. Patient not taking: Reported on 11/08/2022  09/16/21   Leroy Sea, MD  olopatadine (PATANOL) 0.1 % ophthalmic solution Place 1 drop into both eyes 2 (two) times daily as needed for allergies (Itchy, watery eyes). 11/08/22   Marcelyn Bruins, MD      Allergies    Bee venom, Egg-derived products, Other, Peanut-containing drug products, and Pollen extract    Review of Systems   Review of Systems  Physical Exam Updated Vital Signs BP 121/75 (BP Location: Right Arm)   Pulse 99   Temp 98.7 F (37.1 C) (Oral)   Resp 20   Ht 5\' 2"  (1.575 m)   Wt 79.4 kg   LMP 12/05/2022   SpO2 100%   BMI 32.01 kg/m  Physical Exam Vitals and nursing [name] reviewed.  Constitutional:      Appearance: She is well-developed.  HENT:     Head: Atraumatic.  Cardiovascular:     Rate and Rhythm: Normal rate.  Pulmonary:     Effort: Pulmonary effort is normal.     Breath sounds: No wheezing.  Musculoskeletal:     Cervical back: Normal range of motion and neck supple.  Skin:    General: Skin is warm and dry.  Neurological:     Mental Status: She is alert and oriented to person, place, and time.     ED Results / Procedures / Treatments   Labs (all labs ordered are listed, but only abnormal results are displayed) Labs Reviewed  RESP PANEL BY RT-PCR (RSV, FLU A&B, COVID)  [id number]    EKG  None  Radiology DG Chest 2 View  Result Date: 01/14/2023 CLINICAL DATA:  Cough, shortness of breath EXAM: CHEST - 2 VIEW COMPARISON:  12/16/2022 FINDINGS: The heart size and mediastinal contours are within normal limits. Both lungs are clear. The visualized skeletal structures are unremarkable. IMPRESSION: No active cardiopulmonary disease. Electronically Signed   By: Ernie Avena M.D.   On: 01/14/2023 13:05    Procedures Procedures    Medications Ordered in ED Medications  ipratropium-albuterol (DUONEB) 0.5-2.5 (3) MG/3ML nebulizer solution 3 mL (3 mLs Nebulization Given 01/14/23 1230)  albuterol (PROVENTIL) (2.5 MG/3ML) 0.083%  nebulizer solution 5 mg (5 mg Nebulization Given 01/14/23 1555)  predniSONE (DELTASONE) tablet 60 mg (60 mg Oral Given 01/14/23 1515)    ED Course/ Medical Decision Making/ A&P                             Medical Decision Making Amount and/or Complexity of Data Reviewed Radiology: ordered.  Risk Prescription drug management.    OBJECTIVE: She appears well, vital signs are as noted. Ears normal.  Throat and pharynx normal.  Neck supple. No adenopathy in the neck. Nose is congested. Sinuses non tender. The chest is clear, without wheezes or rales.  Differential diagnosis includes viral URI, asthma exacerbation, PE, pneumonia.  X-ray of the chest independently interpreted, no evidence of pleural effusion or pneumonia.  ASSESSMENT:  viral upper respiratory illness and asthma  PLAN: Symptomatic therapy suggested: push fluids, rest, and return office visit prn if symptoms persist or worsen. Lack of antibiotic effectiveness discussed with her. Call or return to clinic prn if these symptoms worsen or fail to improve as anticipated.  Final Clinical Impression(s) / ED Diagnoses Final diagnoses:  Mild intermittent asthma with exacerbation  Viral URI with cough    Rx / DC Orders ED Discharge Orders          Ordered    albuterol (VENTOLIN HFA) 108 (90 Base) MCG/ACT inhaler  Every 4 hours PRN        01/14/23 1610    predniSONE (DELTASONE) 20 MG tablet  Daily        01/14/23 1610              Derwood Kaplan, MD 01/14/23 1612

## 2023-01-14 NOTE — Discharge Instructions (Signed)
We saw you in the ER for your asthma related complains. We gave you some breathing treatments in the ER, and seems like your symptoms have improved. Please take albuterol as needed every 4 hours. Please take the medications prescribed. Please refrain from smoking or smoke exposure. Please see a primary care doctor in 1 week. Return to the ER if your symptoms worsen.  

## 2023-01-14 NOTE — ED Notes (Signed)
Patient received and verbalized understanding of discharge teaching. Pt states they have no questions regarding their care at the time of discharge.

## 2023-01-14 NOTE — ED Triage Notes (Signed)
Patient here POV from Home.  Endorses SOB, Productive Cough, Headache that began 2 Days. Recent Use of Breathing Treatment with some short relief. No Fevers or Sore Throat.   NAD Noted during Triage. A&Ox4. GCS 15. Ambulatory.

## 2023-01-31 ENCOUNTER — Ambulatory Visit: Payer: Federal, State, Local not specified - PPO | Admitting: Allergy

## 2023-02-06 ENCOUNTER — Other Ambulatory Visit: Payer: Self-pay

## 2023-02-06 ENCOUNTER — Encounter: Payer: Self-pay | Admitting: Allergy

## 2023-02-06 ENCOUNTER — Ambulatory Visit (INDEPENDENT_AMBULATORY_CARE_PROVIDER_SITE_OTHER): Payer: Federal, State, Local not specified - PPO | Admitting: Allergy

## 2023-02-06 DIAGNOSIS — J3089 Other allergic rhinitis: Secondary | ICD-10-CM

## 2023-02-06 DIAGNOSIS — T7800XD Anaphylactic reaction due to unspecified food, subsequent encounter: Secondary | ICD-10-CM

## 2023-02-06 DIAGNOSIS — J4550 Severe persistent asthma, uncomplicated: Secondary | ICD-10-CM | POA: Diagnosis not present

## 2023-02-06 DIAGNOSIS — J302 Other seasonal allergic rhinitis: Secondary | ICD-10-CM

## 2023-02-06 DIAGNOSIS — H1013 Acute atopic conjunctivitis, bilateral: Secondary | ICD-10-CM

## 2023-02-06 MED ORDER — EPIPEN 2-PAK 0.3 MG/0.3ML IJ SOAJ: INTRAMUSCULAR | 1 refills | Status: AC

## 2023-02-06 MED ORDER — CETIRIZINE HCL 10 MG PO TABS: ORAL_TABLET | ORAL | 5 refills | Status: DC

## 2023-02-06 MED ORDER — BREZTRI AEROSPHERE 160-9-4.8 MCG/ACT IN AERO: 2.0000 | INHALATION_SPRAY | Freq: Two times a day (BID) | RESPIRATORY_TRACT | 5 refills | Status: DC

## 2023-02-06 MED ORDER — IPRATROPIUM BROMIDE 0.06 % NA SOLN: 2.0000 | Freq: Four times a day (QID) | NASAL | 5 refills | Status: DC | PRN

## 2023-02-06 MED ORDER — OLOPATADINE HCL 0.1 % OP SOLN: 1.0000 [drp] | Freq: Two times a day (BID) | OPHTHALMIC | 5 refills | Status: DC | PRN

## 2023-02-06 MED ORDER — ALBUTEROL SULFATE HFA 108 (90 BASE) MCG/ACT IN AERS: 2.0000 | INHALATION_SPRAY | RESPIRATORY_TRACT | 1 refills | Status: DC | PRN

## 2023-02-06 NOTE — Progress Notes (Signed)
RE: Jacqueline [name] MRN: [id number] DOB: 05-06-2001 Date of Telemedicine Visit: 02/06/2023  Referring provider: No ref. provider found Primary care provider: Pcp, No  Chief Complaint: Follow-up   Telemedicine Follow Up Visit via Telephone: I connected with Jacqueline [name] for a follow up on 02/06/23 by telephone and verified that I am speaking with the correct person using two identifiers.   I discussed the limitations, risks, security and privacy concerns of performing an evaluation and management service by telephone and the availability of in person appointments. I also discussed with the patient that there may be a patient responsible charge related to this service. The patient expressed understanding and agreed to proceed.  Patient is at home.  Provider is at the office.  Visit start time: 310p Visit end time: 332p Insurance consent/check in by: [facility] Medical consent and medical assistant/nurse: Ashleigh  History of Present Illness: She is a 22 y.o. female, who is being followed for severe asthma, allergic rhinitis with conjunctivitis and food allergy. Her previous allergy office visit was on 11/08/2022 with Dr. Delorse Lek.  Her asthma remains poorly controlled.  She has had 3 ED visits since her last visit for her asthma.  However she states these ED visits at least 2 of them were for more sinus drainage that drained to her chest that worsened her asthma.  These visits were on 11/25/2022, 12/16/2022 and 01/14/2023.  With these visit she was treated with systemic steroids (Decadron or prednisone) as well as received breathing treatments.  Chest x-rays from 12/16/2022 and 01/14/2023 unremarkable. At her last visit I recommended that she use Breztri as her controller medication as this has been more effective for her than other agents.  We provided her with samples which she used.  She states she has recently found 1 of those samples that she misplaced.  When she went to get the  prescription from the pharmacy she was told was going to be $80 and that she was not able to get this filled.  She is not sure of the pharmacy ran the co-pay assistance code to get the medication at 0 cost.  Thus she only has albuterol at this time to use for symptoms.  She has been using albuterol pretty much daily and sometimes multiple times a day.  She feels the last time she used the Markus Daft was sometime in June. She feels her allergies are acting up and she is noting more nasal drainage.  She is taking cetirizine daily.  She does have Flonase for nasal congestion control.  She also olopatadine for itchy watery eye control. She continues to avoid peanuts and tree nuts.  She wants to be able to update her food allergy testing at this time.  She knows at this time she is unable to do skin testing as her asthma is not controlled.  She also wants to look at the egg yolk as she can only tolerate the egg whites.  She has access to an epinephrine device that she has not needed to use.  Assessment and Plan: [name] is a 22 y.o. female with:   We have discussed the importance today of staying consistent with your medications and appointments as well as filling your prescriptions regularly with your pharmacy.   We need to decrease your asthma flares so you aren't going to the ED as much as you have and reduce prednisone usage.   The asthma control plan has been to start Tezspire however you need to refill your medications appropriately  at your pharmacy in order to get approval of Tezspire.  Asthma - poorly controlled at this time Breztri 2 puffs twice a day with a spacer to prevent cough or wheeze.   [facility] the co-pay assistance code provided in the prescription. Continue albuterol 2 puffs every 4 hours as needed for cough or wheeze OR Instead use albuterol 0.083% solution via nebulizer one unit vial every 4 hours as needed for cough or wheeze You may use albuterol 2 puffs 5 to 15  minutes before activity to decrease cough or wheeze Tezspire monthly injections is the plan however have not received insurance approval due to not appearing compliant with our controller medication.  She was provided with a sample at visit in April.     Asthma control goals:  Full participation in all desired activities (may need albuterol before activity) Albuterol use two time or less a week on average (not counting use with activity) Cough interfering with sleep two time or less a month Oral steroids no more than once a year No hospitalizations  Allergic rhinitis Continue allergen avoidance measures directed toward pollen, pets, and dust mites as listed below. Cetirizine 10 mg once a day.  May take additional dose if needed Flonase 2 sprays each nostril daily for 1-2 weeks at a time before stopping once nasal congestion improves for maximum benefit Start nasal Atrovent 2 sprays each nostril up to 4 times a day as needed for runny nose control.  Perform nasal saline rinse with either distilled water or boil water and bring to room temperature.  Do not use water straight from the tap.    Allergic conjunctivitis Olopatadine 1 drop in each eye twice a day as needed for red or itchy eyes  Food allergy Continue to avoid peanuts and tree nuts. In case of an allergic reaction, take Benadryl 50 mg every 4 hours, and if life-threatening symptoms occur, inject with EpiPen 0.3 mg.  Advised we can go ahead with serum IgE testing for food allergy update as well as environmental allergy testing since her asthma is not in a place to perform skin testing.  She can come to the office when the office is open and to send an appointment for lab draw.   Follow up in 2 month or sooner if needed.    Diagnostics: None.  Medication List:  Current Outpatient Medications  Medication Sig Dispense Refill   fluticasone (FLONASE) 50 MCG/ACT nasal spray 2 sprays per nostril daily for 1-2 weeks at a time before  stopping once nasal congestion improves. 16 g 5   ipratropium (ATROVENT) 0.06 % nasal spray Place 2 sprays into both nostrils 4 (four) times daily as needed (runny nose, nasal drainage). 15 mL 5   ipratropium-albuterol (DUONEB) 0.5-2.5 (3) MG/3ML SOLN Take it 2 times scheduled and every 8 hours as needed if needed for shortness of breath and wheezing. 360 mL 0   albuterol (VENTOLIN HFA) 108 (90 Base) MCG/ACT inhaler Inhale 2 puffs into the lungs every 4 (four) hours as needed for wheezing or shortness of breath. 18 g 1   BREZTRI AEROSPHERE 160-9-4.8 MCG/ACT AERO Inhale 2 puffs into the lungs in the morning and at bedtime. 10.7 g 5   cetirizine (ZYRTEC) 10 MG tablet Take one tablet once daily 30 tablet 5   EPIPEN 2-PAK 0.3 MG/0.3ML SOAJ injection Use as directed for life threatening allergic reactions 2 each 1   hydrOXYzine (ATARAX/VISTARIL) 25 MG tablet Take 1 tablet (25 mg  total) by mouth 3 (three) times daily as needed for anxiety. (Patient not taking: Reported on 11/08/2022) 30 tablet 1   olopatadine (PATANOL) 0.1 % ophthalmic solution Place 1 drop into both eyes 2 (two) times daily as needed for allergies (Itchy, watery eyes). 5 mL 5   Current Facility-Administered Medications  Medication Dose Route Frequency Provider Last Rate Last Admin   tezepelumab-ekko (TEZSPIRE) 210 MG/1. syringe 210 mg  210 mg Subcutaneous Q28 days Marcelyn Bruins, MD   210 mg at 11/08/22 1144   Allergies: Allergies  Allergen Reactions   Bee Venom Shortness Of Breath   Egg-Derived Products Shortness Of Breath   Other Anaphylaxis and Other (See Comments)    Nuts and Tree nuts Pet dander cats and dogs   Peanut-Containing Drug Products Anaphylaxis   Pollen Extract Swelling   I reviewed her past medical history, social history, family history, and environmental history and no significant changes have been reported from previous visit on 11/08/2022.  Review of Systems 10 point review of systems reviewed  and negative unless noted in HPI Objective: Physical Exam Not obtained as encounter was done via telephone.   Previous notes and tests were reviewed.  I discussed the assessment and treatment plan with the patient. The patient was provided an opportunity to ask questions and all were answered. The patient agreed with the plan and demonstrated an understanding of the instructions.   The patient was advised to call back or seek an in-person evaluation if the symptoms worsen or if the condition fails to improve as anticipated.  I provided 22 minutes of non-face-to-face time during this encounter.  It was my pleasure to participate in Urbana Pates's care today. Please feel free to contact me with any questions or concerns.   Sincerely,  Fanta Wimberley Larose Hires, MD

## 2023-02-06 NOTE — Patient Instructions (Addendum)
We have discussed the importance today of staying consistent with your medications and appointments as well as filling your prescriptions regularly with your pharmacy.   We need to decrease your asthma flares so you aren't going to the ED as much as you have and reduce prednisone usage.   The asthma control plan has been to start Tezspire however you need to refill your medications appropriately at your pharmacy in order to get approval of Tezspire.  Asthma - poorly controlled at this time Breztri 2 puffs twice a day with a spacer to prevent cough or wheeze.   [facility]ill [facility] the co-pay assistance code provided in the prescription. Continue albuterol 2 puffs every 4 hours as needed for cough or wheeze OR Instead use albuterol 0.083% solution via nebulizer one unit vial every 4 hours as needed for cough or wheeze You may use albuterol 2 puffs 5 to 15 minutes before activity to decrease cough or wheeze Tezspire monthly injections is the plan however have not received insurance approval due to not appearing compliant with our controller medication.  She was provided with a sample at visit in April.     Asthma control goals:  Full participation in all desired activities (may need albuterol before activity) Albuterol use two time or less a week on average (not counting use with activity) Cough interfering with sleep two time or less a month Oral steroids no more than once a year No hospitalizations  Allergic rhinitis Continue allergen avoidance measures directed toward pollen, pets, and dust mites as listed below. Cetirizine 10 mg once a day.  May take additional dose if needed Flonase 2 sprays each nostril daily for 1-2 weeks at a time before stopping once nasal congestion improves for maximum benefit Start nasal Atrovent 2 sprays each nostril up to 4 times a day as needed for runny nose control.  Perform nasal saline rinse with either distilled water or boil water and bring to  room temperature.  Do not use water straight from the tap.    Allergic conjunctivitis Olopatadine 1 drop in each eye twice a day as needed for red or itchy eyes  Food allergy Continue to avoid peanuts and tree nuts. In case of an allergic reaction, take Benadryl 50 mg every 4 hours, and if life-threatening symptoms occur, inject with EpiPen 0.3 mg.  Advised we can go ahead with serum IgE testing for food allergy update as well as environmental allergy testing since her asthma is not in a place to perform skin testing.  She can come to the office when the office is open and to send an appointment for lab draw.   Follow up in 2 month or sooner if needed.

## 2023-02-28 IMAGING — DX DG CHEST 1V PORT
1 series · 2 of 2 positions shown · non-contrast
Comparison: Chest x-ray 01/04/2020.

CLINICAL DATA: Cough.

EXAM:
PORTABLE CHEST 1 VIEW

[Series 1: chest · 0.14mm/px · 2 of 2 slices shown]
[im 1/2]
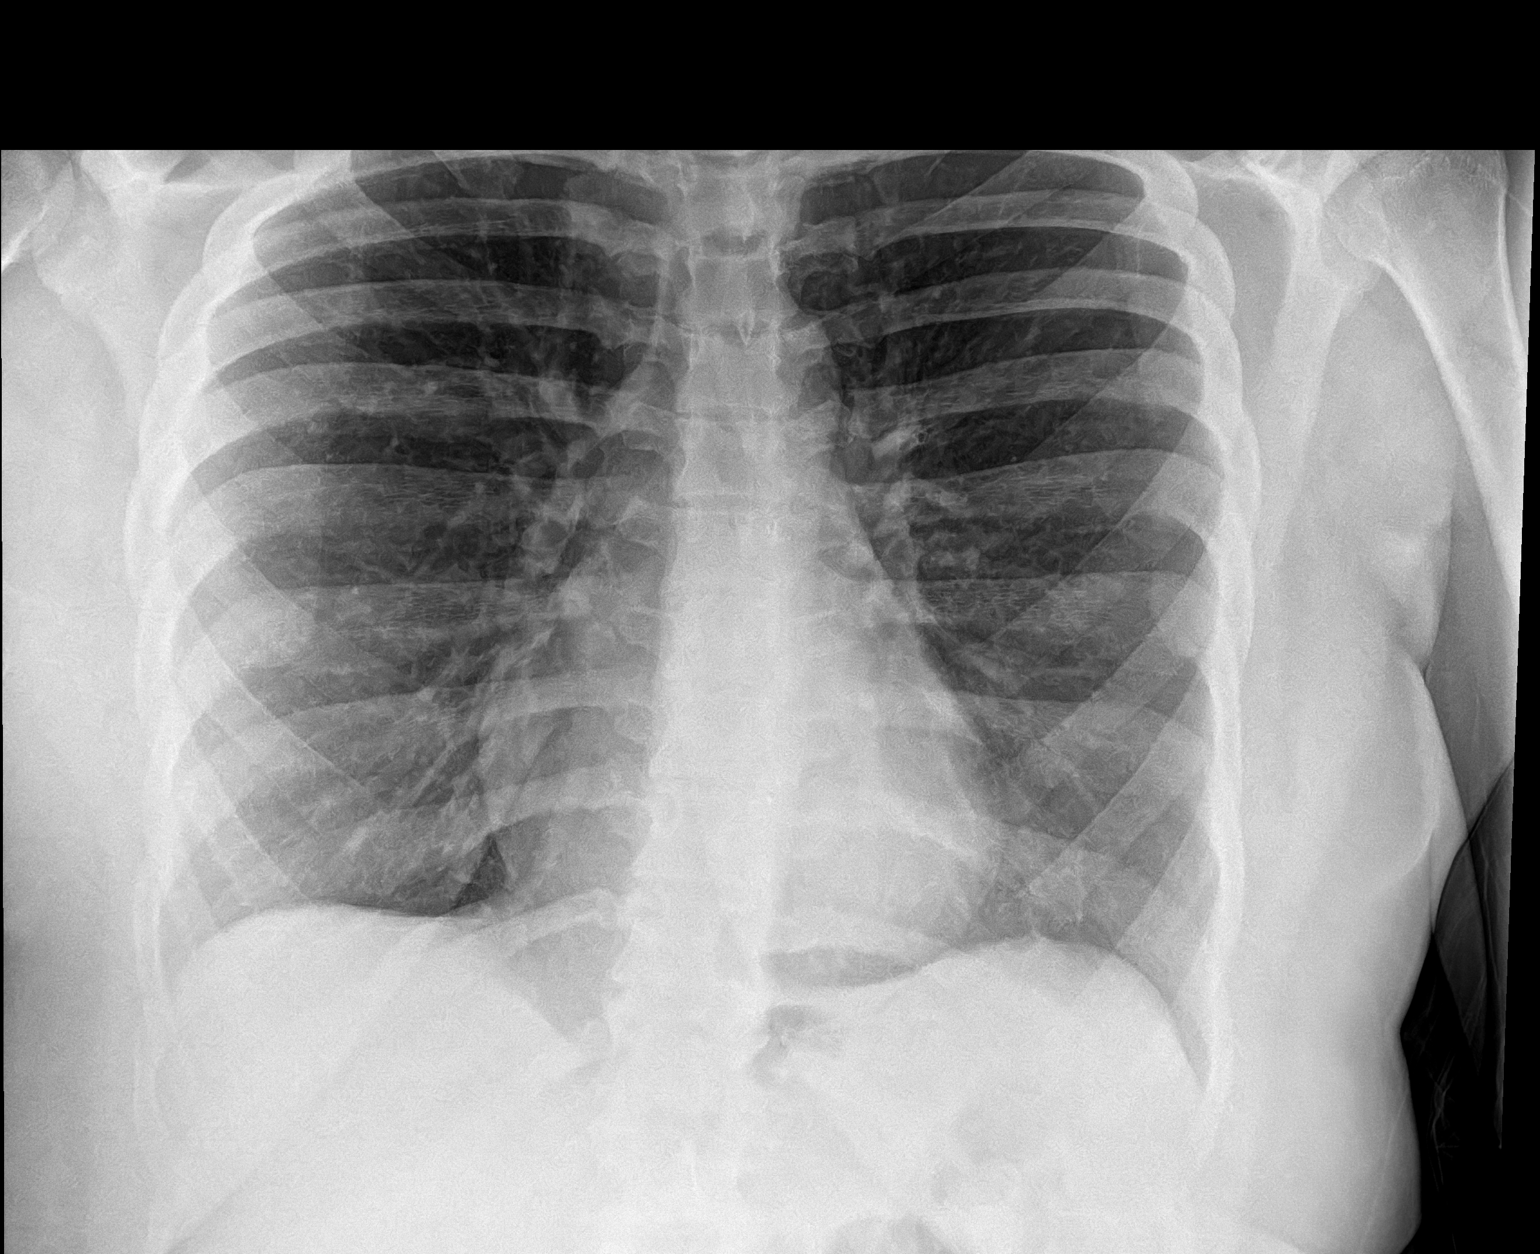
[im 2/2]
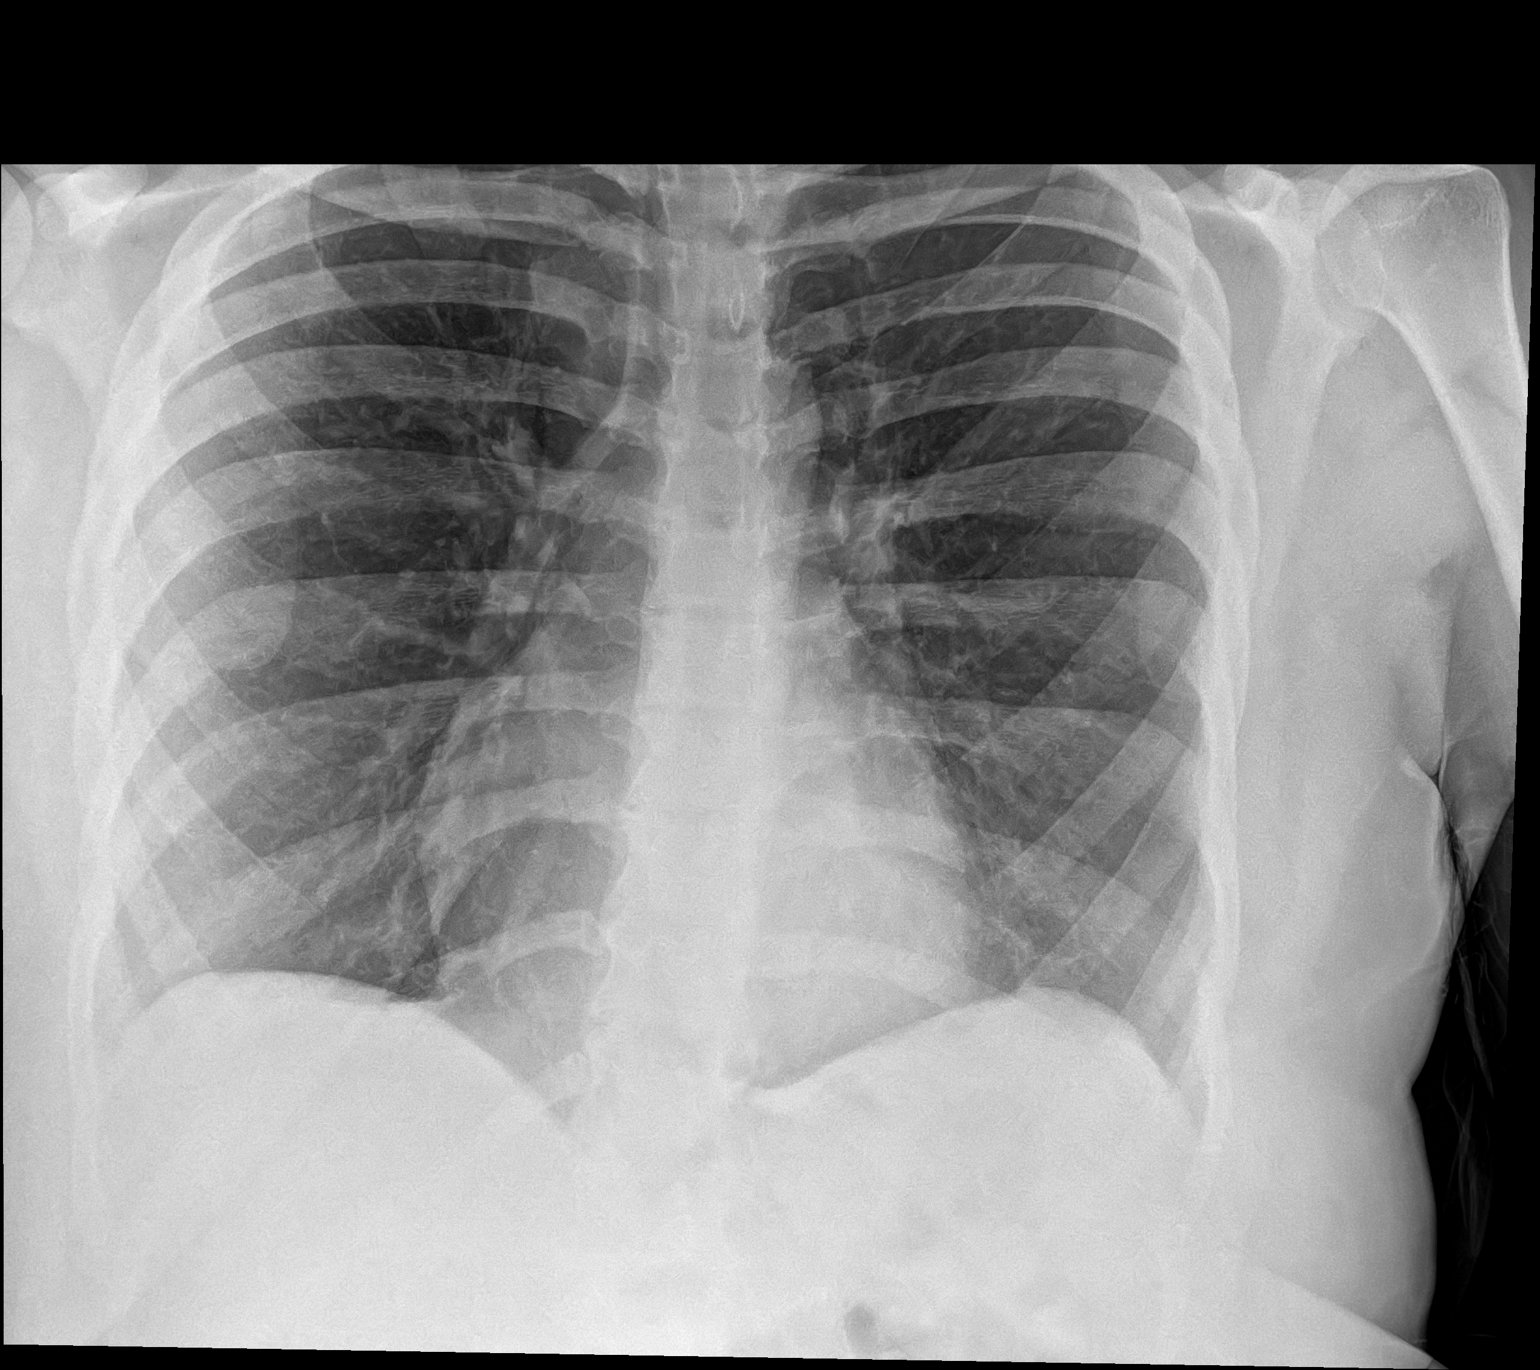

[2 of 2 positions shown; findings below may reference images not displayed]

FINDINGS: The heart size and mediastinal contours are within normal limits.
Both lungs are clear. The visualized skeletal structures are
unremarkable.
IMPRESSION: No active disease.

## 2023-03-04 ENCOUNTER — Emergency Department (HOSPITAL_BASED_OUTPATIENT_CLINIC_OR_DEPARTMENT_OTHER)
Admission: EM | Admit: 2023-03-04 | Discharge: 2023-03-04 | Discharge status: Against Medical Advice | Payer: Federal, State, Local not specified - PPO

## 2023-03-04 ENCOUNTER — Other Ambulatory Visit: Payer: Self-pay

## 2023-03-04 NOTE — ED Notes (Signed)
Called x2 15 minutes apart. Registration reports pt leaving lobby shortly after arriving.

## 2023-03-05 ENCOUNTER — Emergency Department (HOSPITAL_BASED_OUTPATIENT_CLINIC_OR_DEPARTMENT_OTHER)
Admission: EM | Admit: 2023-03-05 | Discharge: 2023-03-05 | Disposition: A | Discharge status: Home / Self Care | Payer: Federal, State, Local not specified - PPO | Attending: Emergency Medicine | Admitting: Emergency Medicine

## 2023-03-05 ENCOUNTER — Other Ambulatory Visit: Payer: Self-pay

## 2023-03-05 ENCOUNTER — Encounter (HOSPITAL_BASED_OUTPATIENT_CLINIC_OR_DEPARTMENT_OTHER): Payer: Self-pay | Admitting: Emergency Medicine

## 2023-03-05 DIAGNOSIS — J45909 Unspecified asthma, uncomplicated: Secondary | ICD-10-CM | POA: Diagnosis not present

## 2023-03-05 DIAGNOSIS — U071 COVID-19: Secondary | ICD-10-CM | POA: Diagnosis not present

## 2023-03-05 DIAGNOSIS — Z91010 Allergy to peanuts: Secondary | ICD-10-CM | POA: Insufficient documentation

## 2023-03-05 DIAGNOSIS — R062 Wheezing: Secondary | ICD-10-CM | POA: Diagnosis present

## 2023-03-05 LAB — SARS CORONAVIRUS 2 BY RT PCR: SARS Coronavirus 2 by RT PCR: POSITIVE — AB

## 2023-03-05 MED ORDER — MAGNESIUM SULFATE IN D5W 1-5 GM/100ML-% IV SOLN
1.0000 g | Freq: Once | INTRAVENOUS | Status: AC
  Administered 2023-03-05: 1 g via INTRAVENOUS
  Filled 2023-03-05: qty 100

## 2023-03-05 MED ORDER — BENZONATATE 100 MG PO CAPS: 100.0000 mg | ORAL_CAPSULE | Freq: Three times a day (TID) | ORAL | 0 refills | Status: DC

## 2023-03-05 MED ORDER — ACETAMINOPHEN 500 MG PO TABS: 500.0000 mg | ORAL_TABLET | Freq: Four times a day (QID) | ORAL | 0 refills | Status: AC | PRN

## 2023-03-05 MED ORDER — ALBUTEROL SULFATE (2.5 MG/3ML) 0.083% IN NEBU
INHALATION_SOLUTION | RESPIRATORY_TRACT | Status: AC
  Administered 2023-03-05: 10 mg
  Filled 2023-03-05: qty 12

## 2023-03-05 MED ORDER — ALBUTEROL (5 MG/ML) CONTINUOUS INHALATION SOLN: 10.0000 mg/h | INHALATION_SOLUTION | Freq: Once | RESPIRATORY_TRACT | Status: DC

## 2023-03-05 MED ORDER — ALBUTEROL SULFATE (2.5 MG/3ML) 0.083% IN NEBU
2.5000 mg | INHALATION_SOLUTION | Freq: Once | RESPIRATORY_TRACT | Status: AC
  Administered 2023-03-05: 2.5 mg via RESPIRATORY_TRACT
  Filled 2023-03-05: qty 3

## 2023-03-05 MED ORDER — MAGNESIUM SULFATE 50 % IJ SOLN: 1.0000 g | Freq: Once | INTRAMUSCULAR | Status: DC

## 2023-03-05 MED ORDER — IPRATROPIUM-ALBUTEROL 0.5-2.5 (3) MG/3ML IN SOLN
3.0000 mL | Freq: Once | RESPIRATORY_TRACT | Status: AC
  Administered 2023-03-05: 3 mL via RESPIRATORY_TRACT
  Filled 2023-03-05: qty 3

## 2023-03-05 NOTE — ED Triage Notes (Addendum)
Pt with SOB presenting for asthma exacerbation.  Tried breathing tx and inhalers without relief. Mother had covid last week. Endorses HA, runny nose, sore throat x 4 days but states she believes this is r/t sinus infection.  Denies fever. Speaking in full sentences.

## 2023-03-05 NOTE — ED Provider Notes (Signed)
Jacqueline [name] EMERGENCY DEPARTMENT AT [facility] - Jacqueline [name] Provider Note   CSN: [id number] Arrival date & time: 03/05/23  1110     History  No chief complaint on file.   Jacqueline [name] is a 22 y.o. female.  The history is provided by the patient and medical records. No language interpreter was used.     22 year old female significant history of asthma, allergies, depression, presenting today with complaints of cold sxs.  Patient report for the past 3 to 4 days she has had sinus congestion, runny nose, throat irritation, increased shortness of breath and wheezing.  She also endorses headache.  She does not endorse any fever or bodyaches.  She endorses loss of taste and smell.  States her mother was diagnosed with COVID last week.  Patient states she is prone for sinus infection and felt this is similar to prior sinus infection.  She is using her an inhaler and nebulizer at home more than usual.  She denies alcohol or tobacco use.  No prior treatment PE or DVT  Home Medications Prior to Admission medications   Medication Sig Start Date End Date Taking? Authorizing Provider  albuterol (VENTOLIN HFA) 108 (90 Base) MCG/ACT inhaler Inhale 2 puffs into the lungs every 4 (four) hours as needed for wheezing or shortness of breath. 02/06/23   Marcelyn Bruins, MD  BREZTRI AEROSPHERE 160-9-4.8 MCG/ACT AERO Inhale 2 puffs into the lungs in the morning and at bedtime. 02/06/23   Marcelyn Bruins, MD  cetirizine (ZYRTEC) 10 MG tablet Take one tablet once daily 02/06/23   Marcelyn Bruins, MD  EPIPEN 2-PAK 0.3 MG/0.3ML SOAJ injection Use as directed for life threatening allergic reactions 02/06/23   Marcelyn Bruins, MD  fluticasone [facility] 50 MCG/ACT nasal spray 2 sprays per nostril daily for 1-2 weeks at a time before stopping once nasal congestion improves. 11/08/22   Marcelyn Bruins, MD  hydrOXYzine (ATARAX/VISTARIL) 25 MG tablet Take 1 tablet (25 mg  total) by mouth 3 (three) times daily as needed for anxiety. Patient not taking: Reported on 11/08/2022 09/01/18   Alfonso Ramus T, FNP  ipratropium (ATROVENT) 0.06 % nasal spray Place 2 sprays into both nostrils 4 (four) times daily as needed (runny nose, nasal drainage). 02/06/23   Marcelyn Bruins, MD  ipratropium-albuterol (DUONEB) 0.5-2.5 (3) MG/3ML SOLN Take it 2 times scheduled and every 8 hours as needed if needed for shortness of breath and wheezing. 09/16/21   Leroy Sea, MD  olopatadine (PATANOL) 0.1 % ophthalmic solution Place 1 drop into both eyes 2 (two) times daily as needed for allergies (Itchy, watery eyes). 02/06/23   Marcelyn Bruins, MD      Allergies    Bee venom, Egg-derived products, Other, Peanut-containing drug products, and Pollen extract    Review of Systems   Review of Systems  All other systems reviewed and are negative.   Physical Exam Updated Vital Signs BP 124/80 (BP Location: Right Arm)   Pulse 86   Temp 99.3 F (37.4 C) (Oral)   Resp (!) 22   Wt 78 kg   SpO2 95%   BMI 31.46 kg/m  Physical Exam Vitals and nursing note reviewed.  Constitutional:      General: She is not in acute distress.    Appearance: She is well-developed.  HENT:     Head: Atraumatic.     Nose: Nose normal.     Mouth/Throat:     Mouth: Mucous membranes are moist.  Eyes:  Conjunctiva/sclera: Conjunctivae normal.  Cardiovascular:     Rate and Rhythm: Normal rate and regular rhythm.  Pulmonary:     Effort: Pulmonary effort is normal.     Breath sounds: Wheezing present. No rhonchi or rales.  Abdominal:     Palpations: Abdomen is soft.     Tenderness: There is no abdominal tenderness.  Musculoskeletal:     Cervical back: Neck supple.  Skin:    Findings: No rash.  Neurological:     Mental Status: She is alert.  Psychiatric:        Mood and Affect: Mood normal.     ED Results / Procedures / Treatments   Labs (all labs ordered are listed,  but only abnormal results are displayed) Labs Reviewed  SARS CORONAVIRUS 2 BY RT PCR - Abnormal; Notable for the following components:      Result Value   SARS Coronavirus 2 by RT PCR POSITIVE (*)    All other components within normal limits    EKG EKG Interpretation Date/Time:  Wednesday March 05 2023 11:28:43 EDT Ventricular Rate:  93 PR Interval:  140 QRS Duration:  79 QT Interval:  333 QTC Calculation: 415 R Axis:   47  Text Interpretation: Sinus rhythm Confirmed by Vonita Moss [phone]) on 03/05/2023 3:10:55 PM  Radiology No results found.  Procedures Procedures    Medications Ordered in ED Medications  albuterol (PROVENTIL,VENTOLIN) solution continuous neb (10 mg/hr Nebulization Not Given 03/05/23 1207)  albuterol (PROVENTIL) (2.5 MG/3ML) 0.083% nebulizer solution 2.5 mg (2.5 mg Nebulization Given 03/05/23 1130)  ipratropium-albuterol (DUONEB) 0.5-2.5 (3) MG/3ML nebulizer solution 3 mL (3 mLs Nebulization Given 03/05/23 1130)  magnesium sulfate IVPB 1 g 100 mL (0 g Intravenous Stopped 03/05/23 1438)  albuterol (PROVENTIL) (2.5 MG/3ML) 0.083% nebulizer solution (10 mg  Given 03/05/23 1159)    ED Course/ Medical Decision Making/ A&P                                 Medical Decision Making Risk OTC drugs. Prescription drug management.   BP 124/80 (BP Location: Right Arm)   Pulse 86   Temp 99.3 F (37.4 C) (Oral)   Resp (!) 22   Wt 78 kg   SpO2 96%   BMI 31.46 kg/m   30:40 AM 22 year old female significant history of asthma, allergies, depression, presenting today with complaints of cold sxs.  Patient report for the past 3 to 4 days she has had sinus congestion, runny nose, throat irritation, increased shortness of breath and wheezing.  She also endorses headache.  She does not endorse any fever or bodyaches.  She endorses loss of taste and smell.  States her mother was diagnosed with COVID last week.  Patient states she is prone for sinus infection and felt  this is similar to prior sinus infection.  She is using her an inhaler and nebulizer at home more than usual.  She denies alcohol or tobacco use.  No prior treatment PE or DVT  On exam patient is laying in bed with nebulizer on.  As she does have both inspiratory and expiratory wheezes with diminished breath sounds.  Heart with normal rate and rhythm abdomen is soft and nontender nose and throat exam unremarkable.  -Labs ordered, independently viewed and interpreted by me.  Labs remarkable for covid positive -The patient was maintained on a cardiac monitor.  I personally viewed and interpreted the cardiac monitored which showed an  underlying rhythm of: NSR -Imaging including CXR considered but not performed -This patient presents to the ED for concern of cold sxs, this involves an extensive number of treatment options, and is a complaint that carries with it a high risk of complications and morbidity.  The differential diagnosis includes covid, flu, rsv, pna, asthma exacerbation -Co morbidities that complicate the patient evaluation includes asthma -Treatment includes albuterol/atrovent nebs, mag -Reevaluation of the patient after these medicines showed that the patient improved -PCP office notes or outside notes reviewed -Escalation to admission/observation considered: patients feels much better, is comfortable with discharge, and will follow up with PCP -Prescription medication considered, patient comfortable with tessalon and tylenol -Social Determinant of Health considered which includes tobacco use.          Final Clinical Impression(s) / ED Diagnoses Final diagnoses:  Acute respiratory disease due to COVID-19 virus    Rx / DC Orders ED Discharge Orders          Ordered    benzonatate (TESSALON) 100 MG capsule  Every 8 hours        03/05/23 1425    acetaminophen (TYLENOL) 500 MG tablet  Every 6 hours PRN        03/05/23 1425              Fayrene Helper, PA-C 03/05/23  1604    Rondel Baton, MD 03/05/23 1626

## 2023-03-05 NOTE — Progress Notes (Signed)
medicine was ordered in 20 ml syringe which is for pediatric. RT ordered 4- 2.5 albuterol

## 2023-03-05 NOTE — Discharge Instructions (Signed)
Recommendations for at home COVID-19 symptoms management:  Please continue isolation at home.  If have acute worsening of symptoms please go to ER/urgent care for further evaluation. Check pulse oximetry and if below 90-92% please go to ER. The following supplements MAY help:  Vitamin C 500mg twice a day and Quercetin 250-500 mg twice a day Vitamin D3 2000 - 4000 u/day B Complex vitamins Zinc 75-100 mg/day Melatonin 6-10 mg at night (the optimal dose is unknown) Aspirin 81mg/day (if no history of bleeding issues)  

## 2023-03-05 NOTE — Progress Notes (Signed)
Pt had a albuterol nebulizer at home and a albuterol inhaler. Pt still wheezing with insp. and exp wheezes with exp more prominent. RT gave the Pt a duoneb and an albuterol. RT will continue to monitor

## 2023-04-19 IMAGING — DX DG CHEST 1V PORT
1 series · 1 of 1 positions shown · non-contrast
Comparison: Chest x-ray 06/04/2021

CLINICAL DATA: sob

EXAM:
PORTABLE CHEST 1 VIEW.  Patient is rotated.

[chest]
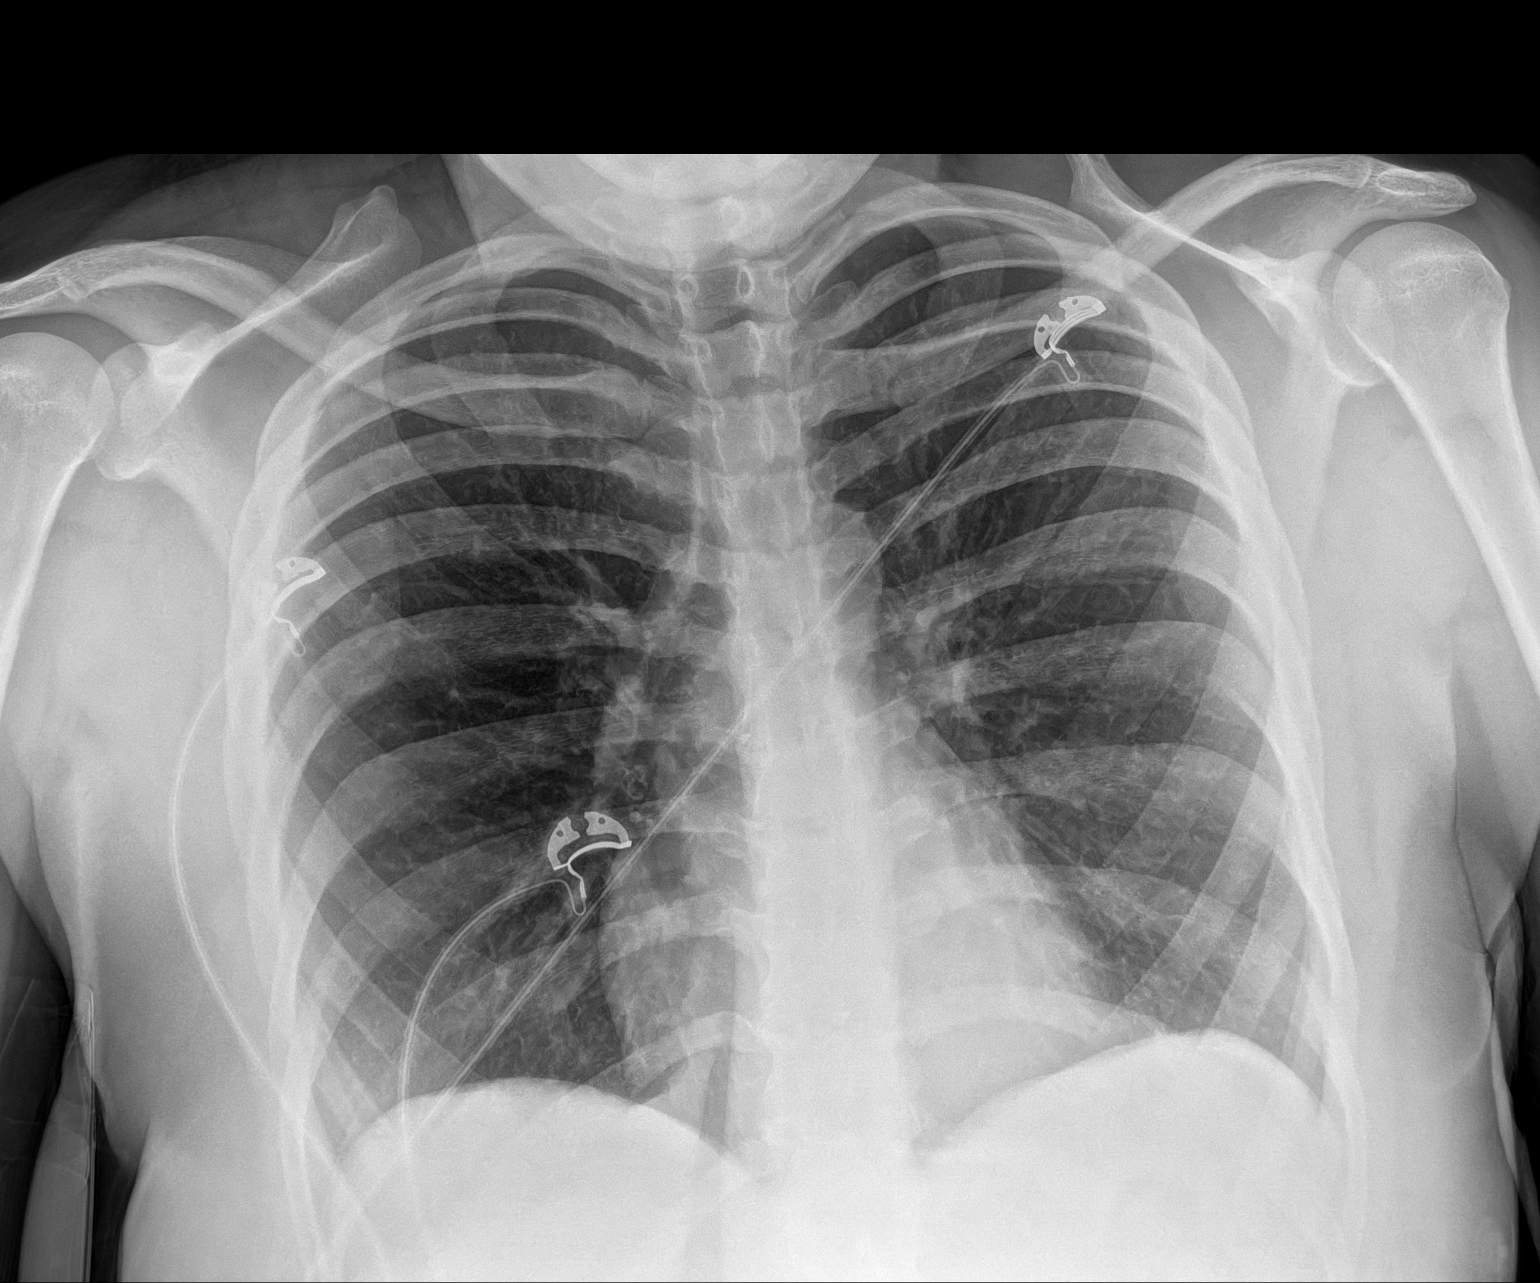

[1 of 1 positions shown; findings below may reference images not displayed]

FINDINGS: The heart and mediastinal contours are unchanged.

No focal consolidation. No pulmonary edema. No pleural effusion. No
pneumothorax.

No acute osseous abnormality.
IMPRESSION: No active disease.

## 2023-04-23 ENCOUNTER — Ambulatory Visit
Admission: EM | Admit: 2023-04-23 | Discharge: 2023-04-23 | Disposition: A | Discharge status: Home / Self Care | Payer: Federal, State, Local not specified - PPO | Attending: Nurse Practitioner | Admitting: Nurse Practitioner

## 2023-04-23 ENCOUNTER — Ambulatory Visit: Payer: Federal, State, Local not specified - PPO

## 2023-04-23 DIAGNOSIS — S93601A Unspecified sprain of right foot, initial encounter: Secondary | ICD-10-CM

## 2023-04-23 NOTE — ED Provider Notes (Signed)
[facility]    CSN: [id number] Arrival date & time: 04/23/23  1351      History   Chief Complaint No chief complaint on file.   HPI Jacqueline [name] is a 22 y.o. female.   The history is provided by the patient.   Patient presents for complaints of right foot pain after a fall 2 days ago.  Patient states she ended up landing on her left knee and then bent her right foot backwards.  Patient complains of pain that starts in her big toe.  She states that the pain does radiate down into her smaller toes.  Patient reports walking on the foot makes her symptoms worse.  She denies numbness, tingling, or inability to bear weight.  Patient reports she has taken over-the-counter ibuprofen for pain or discomfort.  Denies any prior foot injury or trauma.  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   Severe persistent asthma without complication 01/28/2020   Migraine without aura and without status migrainosus, not intractable 10/26/2018   Anxiety and depression    Asthma, not well controlled, severe persistent, with acute exacerbation 04/27/2018   Self-injurious behavior 04/04/2018   MDD (major depressive disorder), recurrent, severe, 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   Acute respiratory failure, unsp w hypoxia or hypercapnia (HCC) 01/09/2017    Past Surgical History:  Procedure Laterality Date   GANGLION CYST EXCISION Right 04/26/2021   Procedure: right dorsal carpal ganglion cyst excision;  Surgeon: Gomez Cleverly, MD;  Location: [facility] OR;  Service: Orthopedics;  Laterality: Right;  with MAC anesthesia needs  30 minutes   INCISION AND DRAINAGE WOUND WITH NAILBED REPAIR Left 04/26/2021   Procedure: INCISION AND DRAINAGE WOUND WITH NAILBED REPAIR LEFT MIDDLE FINGER;  Surgeon: Gomez Cleverly, MD;  Location: [facility] OR;  Service: Orthopedics;  Laterality: Left;   UMBILICAL HERNIA REPAIR      OB History   No obstetric history on file.      Home Medications    Prior to Admission medications   Medication Sig Start Date End Date Taking? Authorizing Provider  acetaminophen (TYLENOL) 500 MG tablet Take 1 tablet (500 mg total) by mouth every 6 (six) hours as needed. 03/05/23   Fayrene Helper, PA-C  albuterol (VENTOLIN HFA) 108 (90 Base) MCG/ACT inhaler Inhale 2 puffs into the lungs every 4 (four) hours as needed for wheezing or shortness of breath. 02/06/23   Marcelyn Bruins, MD  benzonatate (TESSALON) 100 MG capsule Take 1 capsule (100 mg total) by mouth every 8 (eight) hours. 03/05/23   Fayrene Helper, PA-C  BREZTRI AEROSPHERE 160-9-4.8 MCG/ACT AERO Inhale 2 puffs into the lungs in the morning and at bedtime. 02/06/23   Marcelyn Bruins, MD  cetirizine (ZYRTEC) 10 MG tablet Take one tablet once daily 02/06/23   Marcelyn Bruins, MD  EPIPEN 2-PAK 0.3 MG/0.3ML SOAJ injection Use as directed for life threatening allergic reactions 02/06/23   Marcelyn Bruins, MD  fluticasone [facility]) 50 MCG/ACT nasal spray 2 sprays per nostril daily for 1-2 weeks at a time before stopping  once nasal congestion improves. 11/08/22   Marcelyn Bruins, MD  hydrOXYzine (ATARAX/VISTARIL) 25 MG tablet Take 1 tablet (25 mg total) by mouth 3 (three) times daily as needed for anxiety. Patient not taking: Reported on 11/08/2022 09/01/18   Alfonso Ramus T, FNP  ipratropium (ATROVENT) 0.06 % nasal spray Place 2 sprays into both nostrils 4 (four) times daily as needed (runny nose, nasal drainage). 02/06/23   Marcelyn Bruins, MD  ipratropium-albuterol (DUONEB) 0.5-2.5 (3) MG/3ML SOLN Take it 2 times  scheduled and every 8 hours as needed if needed for shortness of breath and wheezing. 09/16/21   Leroy Sea, MD  olopatadine (PATANOL) 0.1 % ophthalmic solution Place 1 drop into both eyes 2 (two) times daily as needed for allergies (Itchy, watery eyes). 02/06/23   Marcelyn Bruins, MD    Family History Family History  Problem Relation Age of Onset   Allergic rhinitis Mother    Asthma Mother    COPD Mother    Migraines Mother    Allergic rhinitis Father    Asthma Father    Schizophrenia Father    Asthma Sister    Asthma Brother    Migraines Brother     Social History Social History   Tobacco Use   Smoking status: Some Days    Current packs/day: 0.00    Average packs/day: 0.3 packs/day for 4.0 years (1.0 ttl pk-yrs)    Types: Cigarettes, Cigars    Start date: 11/06/2015    Last attempt to quit: 11/06/2019    Years since quitting: 3.4   Smokeless tobacco: Never   Tobacco comments:    Patient smokes black n milds  Vaping Use   Vaping status: Never Used  Substance Use Topics   Alcohol use: Not Currently    Comment: has a drink occasionally   Drug use: Not Currently    Types: Marijuana    Comment: rarely     Allergies   Bee venom, Egg-derived products, Other, Peanut-containing drug products, and Pollen extract   Review of Systems Review of Systems Per HPI  Physical Exam Triage Vital Signs ED Triage Vitals  Encounter Vitals Group     BP 04/23/23 1432 121/79     Systolic BP Percentile --      Diastolic BP Percentile --      Pulse Rate 04/23/23 1432 81     Resp 04/23/23 1432 18     Temp 04/23/23 1432 98.1 F (36.7 C)     Temp Source 04/23/23 1432 Oral     SpO2 04/23/23 1432 94 %     Weight --      Height --      Head Circumference --      Peak Flow --      Pain Score 04/23/23 1435 8     Pain Loc --      Pain Education --      Exclude from Growth Chart --    No data found.  Updated Vital Signs BP 121/79 (BP Location: Right Arm)   Pulse  81   Temp 98.1 F (36.7 C) (Oral)   Resp 18   LMP 04/14/2023 (Exact Date)   SpO2 94%   Visual Acuity Right Eye Distance:   Left Eye Distance:   Bilateral Distance:    Right Eye Near:   Left Eye Near:    Bilateral Near:     Physical Exam Vitals and nursing note reviewed.  Constitutional:      General:  She is not in acute distress.    Appearance: Normal appearance.  HENT:     Head: Normocephalic.  Eyes:     Extraocular Movements: Extraocular movements intact.     Pupils: Pupils are equal, round, and reactive to light.  Pulmonary:     Effort: Pulmonary effort is normal.  Musculoskeletal:     Cervical back: Normal range of motion.     Right foot: Normal range of motion and normal capillary refill. Tenderness (1st metatarsal) present. No deformity. Normal pulse.  Skin:    General: Skin is warm and dry.  Neurological:     General: No focal deficit present.     Mental Status: She is alert and oriented to person, place, and time.  Psychiatric:        Mood and Affect: Mood normal.        Behavior: Behavior normal.      UC Treatments / Results  Labs (all labs ordered are listed, but only abnormal results are displayed) Labs Reviewed - No data to display  EKG   Radiology DG Foot Complete Right  Result Date: 04/23/2023 CLINICAL DATA:  Fall, toe pain EXAM: RIGHT FOOT COMPLETE - 3+ VIEW COMPARISON:  None Available. FINDINGS: There is no evidence of fracture or dislocation. There is no evidence of arthropathy or other focal bone abnormality. Soft tissues are unremarkable. IMPRESSION: Negative. Electronically Signed   By: Judie Petit.  Shick M.D.   On: 04/23/2023 15:09    Procedures Procedures (including critical care time)  Medications Ordered in UC Medications - No data to display  Initial Impression / Assessment and Plan / UC Course  I have reviewed the triage vital signs and the nursing notes.  Pertinent labs & imaging results that were available during my care of the  patient were reviewed by me and considered in my medical decision making (see chart for details).  Patient is well-appearing, she is in no acute distress, vital signs are stable.  X-ray of the right foot is negative for fracture or dislocation, suspect a sprain of the right foot given the mechanism of injury.  Postop shoe was provided to add compression and support of the right foot.  Supportive care recommendations were provided and discussed with the patient to include continuing over-the-counter analgesics, and RICE therapy.  Patient was given information for orthopedics if symptoms fail to improve.  Patient is in agreement with this plan of care and verbalizes understanding.  All questions were answered.  Patient stable for discharge.  Work note was provided.  Final Clinical Impressions(s) / UC Diagnoses   Final diagnoses:  Right foot sprain, initial encounter     Discharge Instructions      X-ray of the right foot is negative for fracture or dislocation.  I suspect she may have a sprain of the foot. A postop shoe has been provided to add compression and support.  Wear the shoe when you are engaged in prolonged or strenuous activity. Continue over-the-counter ibuprofen or Tylenol as needed for pain or discomfort. RICE therapy, rest, ice, compression, and elevation.  Apply ice for 20 minutes, remove for 1 hour, repeat as needed. If symptoms do not improve over the next 2 to 3 weeks or worsens, it is recommended that you follow-up with orthopedics.  I have given you information for 2 orthopedic offices for follow-up. Follow-up as needed.     ED Prescriptions   None    PDMP not reviewed this encounter.   Abran Cantor, NP 04/23/23  1532  

## 2023-04-23 NOTE — Discharge Instructions (Signed)
X-ray of the right foot is negative for fracture or dislocation.  I suspect she may have a sprain of the foot. A postop shoe has been provided to add compression and support.  Wear the shoe when you are engaged in prolonged or strenuous activity. Continue over-the-counter ibuprofen or Tylenol as needed for pain or discomfort. RICE therapy, rest, ice, compression, and elevation.  Apply ice for 20 minutes, remove for 1 hour, repeat as needed. If symptoms do not improve over the next 2 to 3 weeks or worsens, it is recommended that you follow-up with orthopedics.  I have given you information for 2 orthopedic offices for follow-up. Follow-up as needed.

## 2023-04-23 NOTE — ED Triage Notes (Addendum)
Pt c/o Fall that happened on Sunday  pt states she landed on her left knee, and then her right foot. The right foot is causing pain from the ankle to her toes. Swelling in the toes on the right foot aches when she walks.

## 2023-05-06 ENCOUNTER — Other Ambulatory Visit: Payer: Self-pay

## 2023-05-06 ENCOUNTER — Ambulatory Visit
Admission: RE | Admit: 2023-05-06 | Discharge: 2023-05-06 | Disposition: A | Discharge status: Home / Self Care | Payer: Federal, State, Local not specified - PPO | Source: Ambulatory Visit | Attending: Family Medicine | Admitting: Family Medicine

## 2023-05-06 VITALS — BP 112/80 | HR 76 | Temp 98.3°F | Resp 20

## 2023-05-06 DIAGNOSIS — N390 Urinary tract infection, site not specified: Secondary | ICD-10-CM | POA: Insufficient documentation

## 2023-05-06 DIAGNOSIS — R1030 Lower abdominal pain, unspecified: Secondary | ICD-10-CM | POA: Diagnosis present

## 2023-05-06 DIAGNOSIS — Z113 Encounter for screening for infections with a predominantly sexual mode of transmission: Secondary | ICD-10-CM | POA: Diagnosis present

## 2023-05-06 LAB — POCT URINALYSIS DIP (MANUAL ENTRY)
Bilirubin, UA: NEGATIVE
Glucose, UA: NEGATIVE mg/dL
Ketones, POC UA: NEGATIVE mg/dL
Nitrite, UA: POSITIVE — AB
Protein Ur, POC: NEGATIVE mg/dL
Spec Grav, UA: 1.015 (ref 1.010–1.025)
Urobilinogen, UA: 0.2 U/dL
pH, UA: 5.5 (ref 5.0–8.0)

## 2023-05-06 MED ORDER — CEPHALEXIN 500 MG PO CAPS: 500.0000 mg | ORAL_CAPSULE | Freq: Two times a day (BID) | ORAL | 0 refills | Status: DC

## 2023-05-06 NOTE — Discharge Instructions (Signed)
I have sent over an antibiotic to treat for a suspected urinary tract infection.  I have sent out a urine culture to get further information on this and make sure we have you on the right medicine.  Someone will call if any changes need to be made based on this.  We have also sent out blood work and a vaginal swab to check for STDs.  Someone will call if anything comes back positive on these.

## 2023-05-06 NOTE — ED Triage Notes (Addendum)
Pt reports intermittent lower back pain x1 week. Pt reports urinary frequency, and intermittent radiation of pain to abdomen and "moves around to back". Denies any fever, chills, nausea.  Pt inquiring about STD check as well. Pt denies any known exposures.

## 2023-05-06 NOTE — ED Provider Notes (Addendum)
[facility]    CSN: [id number] Arrival date & time: 05/06/23  1323      History   Chief Complaint Chief Complaint  Patient presents with   Back Pain    Entered by patient    HPI Jacqueline [name] is a 22 y.o. female.   Patient presenting today with about a week of progressively worsening urinary frequency, lower abdominal pain sometimes radiating to the right flank.  Denies any fever, chills, nausea, vomiting, vaginal symptoms, vaginal lesions.  So far not trying to thing over-the-counter for symptoms.  LMP 04/14/2023, denies any concerns for pregnancy today.  She is requesting an STD check as well, no symptoms or exposures just wanting screening.    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   Severe persistent asthma without complication 01/28/2020   Migraine without aura and without status migrainosus, not intractable 10/26/2018   Anxiety and depression    Asthma, not well controlled, severe persistent, with acute exacerbation 04/27/2018   Self-injurious behavior 04/04/2018   MDD (major depressive disorder), recurrent, severe, 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   Acute respiratory failure, unsp w hypoxia or hypercapnia (HCC) 01/09/2017    Past Surgical History:  Procedure Laterality Date   GANGLION CYST EXCISION Right 04/26/2021   Procedure: right dorsal carpal ganglion cyst excision;  Surgeon: Gomez Cleverly, MD;  Location: [facility] OR;  Service: Orthopedics;  Laterality: Right;  with MAC anesthesia needs 30 minutes   INCISION AND DRAINAGE WOUND WITH NAILBED REPAIR Left 04/26/2021   Procedure:  INCISION AND DRAINAGE WOUND WITH NAILBED REPAIR LEFT MIDDLE FINGER;  Surgeon: Gomez Cleverly, MD;  Location: [facility] OR;  Service: Orthopedics;  Laterality: Left;   UMBILICAL HERNIA REPAIR      OB History   No obstetric history on file.      Home Medications    Prior to Admission medications   Medication Sig Start Date End Date Taking? Authorizing Provider  cephALEXin (KEFLEX) 500 MG capsule Take 1 capsule (500 mg total) by mouth 2 (two) times daily. 05/06/23  Yes Particia Nearing, PA-C  acetaminophen (TYLENOL) 500 MG tablet Take 1 tablet (500 mg total) by mouth every 6 (six) hours as needed. 03/05/23   Fayrene Helper, PA-C  albuterol (VENTOLIN HFA) 108 (90 Base) MCG/ACT inhaler Inhale 2 puffs into the lungs every 4 (four) hours as needed for wheezing or shortness of breath. 02/06/23   Marcelyn Bruins, MD  benzonatate (TESSALON) 100 MG capsule Take 1 capsule (100 mg total) by mouth every 8 (eight) hours. 03/05/23   Fayrene Helper, PA-C  BREZTRI AEROSPHERE 160-9-4.8 MCG/ACT AERO Inhale 2 puffs into the lungs in the morning and at bedtime. 02/06/23   Marcelyn Bruins, MD  cetirizine (ZYRTEC) 10 MG tablet Take one tablet once daily 02/06/23   Marcelyn Bruins, MD  EPIPEN 2-PAK 0.3 MG/0.3ML SOAJ injection Use as directed for life threatening allergic reactions 02/06/23   Marcelyn Bruins, MD  fluticasone [facility]) 50 MCG/ACT nasal spray 2 sprays per nostril daily for 1-2 weeks at a time before stopping  once nasal congestion improves. 11/08/22   Marcelyn Bruins, MD  hydrOXYzine (ATARAX/VISTARIL) 25 MG tablet Take 1 tablet (25 mg total) by mouth 3 (three) times daily as needed for anxiety. Patient not taking: Reported on 11/08/2022 09/01/18   Alfonso Ramus T, FNP  ipratropium (ATROVENT) 0.06 % nasal spray Place 2 sprays into both nostrils 4 (four) times daily as needed (runny nose, nasal drainage). 02/06/23   Marcelyn Bruins, MD  ipratropium-albuterol  (DUONEB) 0.5-2.5 (3) MG/3ML SOLN Take it 2 times scheduled and every 8 hours as needed if needed for shortness of breath and wheezing. 09/16/21   Leroy Sea, MD  olopatadine (PATANOL) 0.1 % ophthalmic solution Place 1 drop into both eyes 2 (two) times daily as needed for allergies (Itchy, watery eyes). 02/06/23   Marcelyn Bruins, MD    Family History Family History  Problem Relation Age of Onset   Allergic rhinitis Mother    Asthma Mother    COPD Mother    Migraines Mother    Allergic rhinitis Father    Asthma Father    Schizophrenia Father    Asthma Sister    Asthma Brother    Migraines Brother     Social History Social History   Tobacco Use   Smoking status: Some Days    Current packs/day: 0.00    Average packs/day: 0.3 packs/day for 4.0 years (1.0 ttl pk-yrs)    Types: Cigarettes, Cigars    Start date: 11/06/2015    Last attempt to quit: 11/06/2019    Years since quitting: 3.4   Smokeless tobacco: Never   Tobacco comments:    Patient smokes black n milds  Vaping Use   Vaping status: Never Used  Substance Use Topics   Alcohol use: Not Currently    Comment: has a drink occasionally   Drug use: Not Currently    Types: Marijuana    Comment: rarely   Allergies   Bee venom, Egg-derived products, Other, Peanut-containing drug products, and Pollen extract   Review of Systems Review of Systems PER HPI  Physical Exam Triage Vital Signs ED Triage Vitals  Encounter Vitals Group     BP 05/06/23 1332 112/80     Systolic BP Percentile --      Diastolic BP Percentile --      Pulse Rate 05/06/23 1332 76     Resp 05/06/23 1332 20     Temp 05/06/23 1332 98.3 F (36.8 C)     Temp Source 05/06/23 1332 Oral     SpO2 05/06/23 1332 93 %     Weight --      Height --      Head Circumference --      Peak Flow --      Pain Score 05/06/23 1329 8     Pain Loc --      Pain Education --      Exclude from Growth Chart --    No data found.  Updated Vital  Signs BP 112/80 (BP Location: Right Arm)   Pulse 76   Temp 98.3 F (36.8 C) (Oral)   Resp 20   LMP 04/14/2023 (Exact Date)   SpO2 93%   Visual Acuity Right Eye Distance:   Left Eye Distance:   Bilateral Distance:    Right Eye Near:   Left Eye Near:    Bilateral Near:     Physical Exam Vitals and nursing note reviewed.  Constitutional:      Appearance: Normal appearance.  She is not ill-appearing.  HENT:     Head: Atraumatic.     Mouth/Throat:     Mouth: Mucous membranes are moist.  Eyes:     Extraocular Movements: Extraocular movements intact.     Conjunctiva/sclera: Conjunctivae normal.  Cardiovascular:     Rate and Rhythm: Normal rate and regular rhythm.     Heart sounds: Normal heart sounds.  Pulmonary:     Effort: Pulmonary effort is normal.     Breath sounds: Normal breath sounds.  Abdominal:     General: Bowel sounds are normal. There is no distension.     Palpations: Abdomen is soft.     Tenderness: There is no abdominal tenderness. There is no right CVA tenderness, left CVA tenderness or guarding.  Genitourinary:    Comments: GU exam deferred, self swab performed Musculoskeletal:        General: Normal range of motion.     Cervical back: Normal range of motion and neck supple.  Skin:    General: Skin is warm and dry.  Neurological:     Mental Status: She is alert and oriented to person, place, and time.  Psychiatric:        Mood and Affect: Mood normal.        Thought Content: Thought content normal.        Judgment: Judgment normal.    UC Treatments / Results  Labs (all labs ordered are listed, but only abnormal results are displayed) Labs Reviewed  POCT URINALYSIS DIP (MANUAL ENTRY) - Abnormal; Notable for the following components:      Result Value   Clarity, UA cloudy (*)    Blood, UA trace-lysed (*)    Nitrite, UA Positive (*)    Leukocytes, UA Large (3+) (*)    All other components within normal limits  URINE CULTURE  RPR  HIV ANTIBODY  (ROUTINE TESTING W REFLEX)  CERVICOVAGINAL ANCILLARY ONLY    EKG   Radiology No results found.  Procedures Procedures (including critical care time)  Medications Ordered in UC Medications - No data to display  Initial Impression / Assessment and Plan / UC Course  I have reviewed the triage vital signs and the nursing notes.  Pertinent labs & imaging results that were available during my care of the patient were reviewed by me and considered in my medical decision making (see chart for details).     Vitals and exam overall reassuring today, urinalysis today with evidence of a urinary tract infection.  Treat with Keflex while awaiting urine culture and adjust if needed based on this.  She is also requesting STI screening, HIV and syphilis labs as well as vaginal swab pending.  Safe sexual practices reviewed.  Final Clinical Impressions(s) / UC Diagnoses   Final diagnoses:  Acute lower UTI  Screening examination for STI     Discharge Instructions      I have sent over an antibiotic to treat for a suspected urinary tract infection.  I have sent out a urine culture to get further information on this and make sure we have you on the right medicine.  Someone will call if any changes need to be made based on this.  We have also sent out blood work and a vaginal swab to check for STDs.  Someone will call if anything comes back positive on these.    ED Prescriptions     Medication Sig Dispense Auth. Provider   cephALEXin (KEFLEX) 500 MG capsule Take 1 capsule (  500 mg total) by mouth 2 (two) times daily. 10 capsule Particia Nearing, [place]      PDMP not reviewed this encounter.   Particia Nearing, PA-C 05/06/23 1415    Particia Nearing, [place] 05/06/23 1416

## 2023-05-07 ENCOUNTER — Telehealth: Payer: Self-pay

## 2023-05-07 LAB — CERVICOVAGINAL ANCILLARY ONLY
Bacterial Vaginitis (gardnerella): NEGATIVE
Candida Glabrata: NEGATIVE
Candida Vaginitis: POSITIVE — AB
Chlamydia: NEGATIVE
Comment: NEGATIVE
Comment: NEGATIVE
Comment: NEGATIVE
Comment: NEGATIVE
Comment: NEGATIVE
Comment: NORMAL
Neisseria Gonorrhea: NEGATIVE
Trichomonas: NEGATIVE

## 2023-05-07 LAB — HIV ANTIBODY (ROUTINE TESTING W REFLEX): HIV Screen 4th Generation wRfx: NONREACTIVE

## 2023-05-07 LAB — RPR: RPR Ser Ql: NONREACTIVE

## 2023-05-07 MED ORDER — FLUCONAZOLE 150 MG PO TABS: 150.0000 mg | ORAL_TABLET | Freq: Once | ORAL | 0 refills | Status: AC

## 2023-05-07 NOTE — Telephone Encounter (Signed)
Per protocol, pt requires tx with Diflucan.  Attempted to reach patient x1. Number not working at this time.  Rx sent to pharmacy on file.

## 2023-05-08 ENCOUNTER — Ambulatory Visit: Payer: Federal, State, Local not specified - PPO | Admitting: Allergy

## 2023-05-08 LAB — URINE CULTURE: Culture: >=100000 — AB

## 2023-06-07 ENCOUNTER — Encounter (HOSPITAL_COMMUNITY): Payer: Self-pay | Admitting: *Deleted

## 2023-06-07 ENCOUNTER — Inpatient Hospital Stay (HOSPITAL_COMMUNITY)
Admission: AD | Admit: 2023-06-07 | Discharge: 2023-06-07 | Disposition: A | Discharge status: Home / Self Care | Payer: Federal, State, Local not specified - PPO | Attending: Obstetrics and Gynecology | Admitting: Obstetrics and Gynecology

## 2023-06-07 DIAGNOSIS — Z711 Person with feared health complaint in whom no diagnosis is made: Secondary | ICD-10-CM | POA: Diagnosis not present

## 2023-06-07 DIAGNOSIS — R110 Nausea: Secondary | ICD-10-CM | POA: Diagnosis present

## 2023-06-07 LAB — HCG, QUANTITATIVE, PREGNANCY: hCG, Beta Chain, Quant, S: <1 m[IU]/mL (ref <5)

## 2023-06-07 LAB — POCT PREGNANCY, URINE: Preg Test, Ur: NEGATIVE

## 2023-06-07 NOTE — MAU Note (Signed)
Jacqueline [name] is a 22 y.o. at Unknown here in MAU reporting: period is late, one pos/ one was neg. Denies any problems.  Just been hungry and feeling sick. Nausea only, no vomiting. No pain or bleeding. LMP: 10/10 Onset of complaint: nausea for about a wk Pain score: none Vitals:   06/07/23 1437  BP: 125/83  Pulse: 81  Resp: 16  Temp: 98.4 F (36.9 C)  SpO2: 96%     Lab orders placed from triage:  UPT

## 2023-06-07 NOTE — MAU Provider Note (Addendum)
History     CSN: [id number]  Arrival date and time: 06/07/23 1406   None     Chief Complaint  Patient presents with   Possible Pregnancy   Nausea   Patient reports having a positive and a negative at home pregnancy test. Patient is concerned with validation of pregnancy.   OB History   No obstetric history on file.     Past Medical History:  Diagnosis Date   ADHD    Allergy    Anxiety    Asthma    Depression    Eczema    Environmental allergies    Headache     Past Surgical History:  Procedure Laterality Date   GANGLION CYST EXCISION Right 04/26/2021   Procedure: right dorsal carpal ganglion cyst excision;  Surgeon: Gomez Cleverly, MD;  Location: [facility] OR;  Service: Orthopedics;  Laterality: Right;  with MAC anesthesia needs 30 minutes   INCISION AND DRAINAGE WOUND WITH NAILBED REPAIR Left 04/26/2021   Procedure: INCISION AND DRAINAGE WOUND WITH NAILBED REPAIR LEFT MIDDLE FINGER;  Surgeon: Gomez Cleverly, MD;  Location: [facility] OR;  Service: Orthopedics;  Laterality: Left;   UMBILICAL HERNIA REPAIR      Family History  Problem Relation Age of Onset   Allergic rhinitis Mother    Asthma Mother    COPD Mother    Migraines Mother    Allergic rhinitis Father    Asthma Father    Schizophrenia Father    Asthma Sister    Asthma Brother    Migraines Brother     Social History   Tobacco Use   Smoking status: Some Days    Current packs/day: 0.00    Average packs/day: 0.3 packs/day for 4.0 years (1.0 ttl pk-yrs)    Types: Cigarettes, Cigars    Start date: 11/06/2015    Last attempt to quit: 11/06/2019    Years since quitting: 3.5   Smokeless tobacco: Never   Tobacco comments:    Patient smokes black n milds  Vaping Use   Vaping status: Never Used  Substance Use Topics   Alcohol use: Not Currently    Comment: has a drink occasionally   Drug use: Not Currently    Types: Marijuana    Comment: rarely    Allergies:  Allergies  Allergen Reactions   Bee Venom  Shortness Of Breath   Egg-Derived Products Shortness Of Breath   Other Anaphylaxis and Other (See Comments)    Nuts and Tree nuts Pet dander cats and dogs   Peanut-Containing Drug Products Anaphylaxis   Pollen Extract Swelling    Facility-Administered Medications Prior to Admission  Medication Dose Route Frequency Provider Last Rate Last Admin   tezepelumab-ekko (TEZSPIRE) 210 MG/1. syringe 210 mg  210 mg Subcutaneous Q28 days Marcelyn Bruins, MD   210 mg at 11/08/22 1144   Medications Prior to Admission  Medication Sig Dispense Refill Last Dose   albuterol (VENTOLIN HFA) 108 (90 Base) MCG/ACT inhaler Inhale 2 puffs into the lungs every 4 (four) hours as needed for wheezing or shortness of breath. 18 g 1 Past Week   benzonatate (TESSALON) 100 MG capsule Take 1 capsule (100 mg total) by mouth every 8 (eight) hours. 21 capsule 0 Past Month   BREZTRI AEROSPHERE 160-9-4.8 MCG/ACT AERO Inhale 2 puffs into the lungs in the morning and at bedtime. 10.7 g 5 06/07/2023   cephALEXin (KEFLEX) 500 MG capsule Take 1 capsule (500 mg total) by mouth 2 (two) times daily. 10 capsule  0 Past Week   cetirizine (ZYRTEC) 10 MG tablet Take one tablet once daily 30 tablet 5 Past Week   acetaminophen (TYLENOL) 500 MG tablet Take 1 tablet (500 mg total) by mouth every 6 (six) hours as needed. 30 tablet 0 More than a month   EPIPEN 2-PAK 0.3 MG/0.3ML SOAJ injection Use as directed for life threatening allergic reactions 2 each 1    fluticasone (FLONASE) 50 MCG/ACT nasal spray 2 sprays per nostril daily for 1-2 weeks at a time before stopping once nasal congestion improves. 16 g 5    hydrOXYzine (ATARAX/VISTARIL) 25 MG tablet Take 1 tablet (25 mg total) by mouth 3 (three) times daily as needed for anxiety. (Patient not taking: Reported on 11/08/2022) 30 tablet 1    ipratropium (ATROVENT) 0.06 % nasal spray Place 2 sprays into both nostrils 4 (four) times daily as needed (runny nose, nasal drainage). 15 mL 5     ipratropium-albuterol (DUONEB) 0.5-2.5 (3) MG/3ML SOLN Take it 2 times scheduled and every 8 hours as needed if needed for shortness of breath and wheezing. 360 mL 0 More than a month   olopatadine (PATANOL) 0.1 % ophthalmic solution Place 1 drop into both eyes 2 (two) times daily as needed for allergies (Itchy, watery eyes). 5 mL 5     Review of Systems  Constitutional: Negative.   HENT: Negative.    Eyes: Negative.   Respiratory: Negative.    Cardiovascular: Negative.   Gastrointestinal: Negative.   Endocrine: Negative.   Musculoskeletal: Negative.   Allergic/Immunologic: Negative.    Physical Exam   Blood pressure 125/83, pulse 81, temperature 98.4 F (36.9 C), temperature source Oral, resp. rate 16, height 5' 2.25" (1.581 m), weight 85 kg, last menstrual period 05/01/2023, SpO2 96%.  Physical Exam HENT:     Head: Normocephalic.     Nose: Nose normal.  Pulmonary:     Effort: Pulmonary effort is normal.  Musculoskeletal:        General: Normal range of motion.  Skin:    General: Skin is warm and dry.  Neurological:     Mental Status: She is alert.  Psychiatric:        Mood and Affect: Mood normal.    MAU Course  Procedures  MDM UPT-negative  Urinalysis HCG- <1   Assessment and Plan   1. Physically well but worried  -Patient is stable for discharge and may return to MAU as needed.     Results for orders placed or performed during the hospital encounter of 06/07/23 (from the past 24 hour(s))  Pregnancy, urine POC     Status: None   Collection Time: 06/07/23  2:52 PM  Result Value Ref Range   Preg Test, Ur NEGATIVE NEGATIVE  hCG, quantitative, pregnancy     Status: None   Collection Time: 06/07/23  3:26 PM  Result Value Ref Range   hCG, Beta Chain, Quant, S <1 <5 mIU/mL      Cleda Mccreedy SNM 06/07/2023, 3:09 PM

## 2023-06-12 ENCOUNTER — Ambulatory Visit: Payer: Federal, State, Local not specified - PPO

## 2023-06-12 ENCOUNTER — Encounter: Payer: Self-pay | Admitting: Emergency Medicine

## 2023-06-12 ENCOUNTER — Ambulatory Visit
Admission: EM | Admit: 2023-06-12 | Discharge: 2023-06-12 | Disposition: A | Discharge status: Home / Self Care | Payer: Federal, State, Local not specified - PPO | Attending: Family Medicine | Admitting: Family Medicine

## 2023-06-12 DIAGNOSIS — J069 Acute upper respiratory infection, unspecified: Secondary | ICD-10-CM | POA: Diagnosis present

## 2023-06-12 DIAGNOSIS — J4541 Moderate persistent asthma with (acute) exacerbation: Secondary | ICD-10-CM | POA: Insufficient documentation

## 2023-06-12 DIAGNOSIS — S99921D Unspecified injury of right foot, subsequent encounter: Secondary | ICD-10-CM | POA: Diagnosis present

## 2023-06-12 LAB — POCT RAPID STREP A (OFFICE): Rapid Strep A Screen: NEGATIVE

## 2023-06-12 MED ORDER — ALBUTEROL SULFATE HFA 108 (90 BASE) MCG/ACT IN AERS: 2.0000 | INHALATION_SPRAY | RESPIRATORY_TRACT | 1 refills | Status: DC | PRN

## 2023-06-12 MED ORDER — BREZTRI AEROSPHERE 160-9-4.8 MCG/ACT IN AERO: 2.0000 | INHALATION_SPRAY | Freq: Two times a day (BID) | RESPIRATORY_TRACT | 0 refills | Status: DC

## 2023-06-12 MED ORDER — PREDNISONE 20 MG PO TABS: 40.0000 mg | ORAL_TABLET | Freq: Every day | ORAL | 0 refills | Status: DC

## 2023-06-12 NOTE — Discharge Instructions (Signed)
We will call if anything comes back abnormal on the foot x-ray though I do not suspect this as the initial x-ray after the injury was normal and you are asymptomatic currently.  We should have your COVID results back tomorrow, your strep test was negative today.  I have refilled your inhalers for your asthma as well as given you a course of steroid to help with your wheezing and cough.

## 2023-06-12 NOTE — ED Triage Notes (Addendum)
States she needs a recheck of her right foot from a fall that happened on 09/29.  States her foot is fine, but the store where she fell is requesting that she have proof.  States she also is having sore throat x 2 days. States she is not able to smell well or taste well.  Wants to be checked for covid and strep

## 2023-06-12 NOTE — ED Provider Notes (Addendum)
[facility]    CSN: [id number] Arrival date & time: 06/12/23  1405      History   Chief Complaint No chief complaint on file.   HPI Jacqueline [name] is a 22 y.o. female.   Patient presenting today following up on right foot injury from a fall that occurred on 04/20/2023.  She came on 04/23/2023 and had a foot x-ray done which was negative for acute bony abnormality.  She was diagnosed with a possible foot sprain and placed in a postop shoe for comfort.  She has not been taking anything over-the-counter for symptoms and states that she is currently asymptomatic, with no limitations to her range of motion or activity throughout the day.  She states she is getting a settlement from the store she fell and and they requested that she have a second opinion on the foot.    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   Severe persistent asthma without complication 01/28/2020   Migraine without aura and without status migrainosus, not intractable 10/26/2018   Anxiety and depression    Asthma, not well controlled, severe persistent, with acute exacerbation 04/27/2018   Self-injurious behavior 04/04/2018   MDD (major depressive disorder), recurrent, severe, 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   Acute respiratory failure, unsp w hypoxia or hypercapnia (HCC) 01/09/2017    Past Surgical History:  Procedure Laterality Date   GANGLION CYST EXCISION Right 04/26/2021   Procedure: right dorsal carpal ganglion cyst excision;  Surgeon: Gomez Cleverly, MD;  Location: [facility] OR;  Service: Orthopedics;  Laterality: Right;  with  MAC anesthesia needs 30 minutes   INCISION AND DRAINAGE WOUND WITH NAILBED REPAIR Left 04/26/2021   Procedure: INCISION AND DRAINAGE WOUND WITH NAILBED REPAIR LEFT MIDDLE FINGER;  Surgeon: Gomez Cleverly, MD;  Location: [facility] OR;  Service: Orthopedics;  Laterality: Left;   UMBILICAL HERNIA REPAIR      OB History   No obstetric history on file.      Home Medications    Prior to Admission medications   Medication Sig Start Date End Date Taking? Authorizing Provider  predniSONE (DELTASONE) 20 MG tablet Take 2 tablets (40 mg total) by mouth daily with breakfast. 06/12/23  Yes Particia Nearing, PA-C  acetaminophen (TYLENOL) 500 MG tablet Take 1 tablet (500 mg total) by mouth every 6 (six) hours as needed. 03/05/23   Fayrene Helper, PA-C  albuterol (VENTOLIN HFA) 108 (90 Base) MCG/ACT inhaler Inhale 2 puffs into the lungs every 4 (four) hours as needed for wheezing or shortness of breath. 06/12/23   Particia Nearing, PA-C  BREZTRI AEROSPHERE 160-9-4.8 MCG/ACT AERO Inhale 2 puffs into the lungs in the morning and at bedtime. 06/12/23   Particia Nearing, PA-C  EPIPEN 2-PAK 0.3 MG/0.3ML SOAJ injection Use as directed for life threatening allergic reactions 02/06/23   Marcelyn Bruins, MD    Family History Family History  Problem Relation Age of Onset   Allergic rhinitis Mother    Asthma Mother    COPD Mother    Migraines Mother  Allergic rhinitis Father    Asthma Father    Schizophrenia Father    Asthma Sister    Asthma Brother    Migraines Brother     Social History Social History   Tobacco Use   Smoking status: Some Days    Current packs/day: 0.00    Average packs/day: 0.3 packs/day for 4.0 years (1.0 ttl pk-yrs)    Types: Cigarettes, Cigars    Start date: 11/06/2015    Last attempt to quit: 11/06/2019    Years since quitting: 3.6   Smokeless tobacco: Never   Tobacco comments:    Patient smokes black n milds  Vaping Use   Vaping status: Never Used   Substance Use Topics   Alcohol use: Not Currently    Comment: has a drink occasionally   Drug use: Not Currently    Types: Marijuana    Comment: rarely     Allergies   Bee venom, Egg-derived products, Other, Peanut-containing drug products, and Pollen extract   Review of Systems Review of Systems Per HPI  Physical Exam Triage Vital Signs ED Triage Vitals  Encounter Vitals Group     BP 06/12/23 1415 (!) 144/82     Systolic BP Percentile --      Diastolic BP Percentile --      Pulse Rate 06/12/23 1415 76     Resp 06/12/23 1415 18     Temp 06/12/23 1415 98.9 F (37.2 C)     Temp Source 06/12/23 1415 Oral     SpO2 06/12/23 1415 94 %     Weight --      Height --      Head Circumference --      Peak Flow --      Pain Score 06/12/23 1421 5     Pain Loc --      Pain Education --      Exclude from Growth Chart --    No data found.  Updated Vital Signs BP (!) 144/82 (BP Location: Right Arm)   Pulse 76   Temp 98.9 F (37.2 C) (Oral)   Resp 18   LMP 06/09/2023 (Exact Date)   SpO2 94%   Visual Acuity Right Eye Distance:   Left Eye Distance:   Bilateral Distance:    Right Eye Near:   Left Eye Near:    Bilateral Near:     Physical Exam Vitals and nursing note reviewed.  Constitutional:      Appearance: Normal appearance.  HENT:     Head: Atraumatic.     Right Ear: Tympanic membrane and external ear normal.     Left Ear: Tympanic membrane and external ear normal.     Nose: Nose normal.     Mouth/Throat:     Mouth: Mucous membranes are moist.     Pharynx: Posterior oropharyngeal erythema present. No oropharyngeal exudate.  Eyes:     Extraocular Movements: Extraocular movements intact.     Conjunctiva/sclera: Conjunctivae normal.  Cardiovascular:     Rate and Rhythm: Normal rate and regular rhythm.     Heart sounds: Normal heart sounds.  Pulmonary:     Effort: Pulmonary effort is normal.     Breath sounds: Wheezing present. No rales.  Musculoskeletal:         General: Normal range of motion.     Cervical back: Normal range of motion and neck supple.  Skin:    General: Skin is warm and dry.  Neurological:     Mental  Status: She is alert and oriented to person, place, and time.  Psychiatric:        Mood and Affect: Mood normal.        Thought Content: Thought content normal.      UC Treatments / Results  Labs (all labs ordered are listed, but only abnormal results are displayed) Labs Reviewed  SARS CORONAVIRUS 2 (TAT 6-24 HRS)  POCT RAPID STREP A (OFFICE)    EKG   Radiology No results found.  Procedures Procedures (including critical care time)  Medications Ordered in UC Medications - No data to display  Initial Impression / Assessment and Plan / UC Course  I have reviewed the triage vital signs and the nursing notes.  Pertinent labs & imaging results that were available during my care of the patient were reviewed by me and considered in my medical decision making (see chart for details).     Vital signs reassuring, rapid strep negative, COVID testing pending.  Suspect viral respiratory infection causing asthma exacerbation.  Breztri and albuterol refilled, treat with prednisone, supportive over-the-counter medications and home care.  Regarding her foot injury from several months ago, she is currently asymptomatic, she requested a second x-ray so this was performed today.  This was also negative for acute bony abnormality.  Ibuprofen and Tylenol if needed, follow-up with orthopedics if pain returns.  Suspect viral infection causing her new symptoms, rapid strep was negative, COVID testing pending.  This is causing asthma exacerbation and she is out of her inhalers.  Breztri and albuterol refilled, prednisone sent for current exacerbation.  Discussed supportive over-the-counter medications and home care.  Return for worsening symptoms.  Final Clinical Impressions(s) / UC Diagnoses   Final diagnoses:  Viral URI with cough   Injury of right foot, subsequent encounter  Moderate persistent asthma with acute exacerbation     Discharge Instructions      We will call if anything comes back abnormal on the foot x-ray though I do not suspect this as the initial x-ray after the injury was normal and you are asymptomatic currently.  We should have your COVID results back tomorrow, your strep test was negative today.  I have refilled your inhalers for your asthma as well as given you a course of steroid to help with your wheezing and cough.    ED Prescriptions     Medication Sig Dispense Auth. Provider   albuterol (VENTOLIN HFA) 108 (90 Base) MCG/ACT inhaler Inhale 2 puffs into the lungs every 4 (four) hours as needed for wheezing or shortness of breath. 18 g Roosvelt Maser [place], [place]   BREZTRI AEROSPHERE 160-9-4.8 MCG/ACT AERO Inhale 2 puffs into the lungs in the morning and at bedtime. 10.7 g Particia Nearing, PA-C   predniSONE (DELTASONE) 20 MG tablet Take 2 tablets (40 mg total) by mouth daily with breakfast. 10 tablet Particia Nearing, [place]      PDMP not reviewed this encounter.   Particia Nearing, [place] 06/12/23 1546    Particia Nearing, [place] 06/12/23 1546

## 2023-06-13 LAB — SARS CORONAVIRUS 2 (TAT 6-24 HRS): SARS Coronavirus 2: NEGATIVE

## 2023-07-13 ENCOUNTER — Encounter (HOSPITAL_COMMUNITY): Payer: Self-pay

## 2023-07-13 ENCOUNTER — Emergency Department (HOSPITAL_COMMUNITY)
Admission: EM | Admit: 2023-07-13 | Discharge: 2023-07-13 | Disposition: A | Discharge status: Home / Self Care | Payer: Federal, State, Local not specified - PPO | Attending: Emergency Medicine | Admitting: Emergency Medicine

## 2023-07-13 ENCOUNTER — Other Ambulatory Visit: Payer: Self-pay

## 2023-07-13 DIAGNOSIS — J4541 Moderate persistent asthma with (acute) exacerbation: Secondary | ICD-10-CM | POA: Insufficient documentation

## 2023-07-13 DIAGNOSIS — R051 Acute cough: Secondary | ICD-10-CM

## 2023-07-13 DIAGNOSIS — R059 Cough, unspecified: Secondary | ICD-10-CM | POA: Diagnosis present

## 2023-07-13 DIAGNOSIS — Z91010 Allergy to peanuts: Secondary | ICD-10-CM | POA: Diagnosis not present

## 2023-07-13 MED ORDER — ALBUTEROL SULFATE HFA 108 (90 BASE) MCG/ACT IN AERS
2.0000 | INHALATION_SPRAY | Freq: Once | RESPIRATORY_TRACT | Status: AC
  Administered 2023-07-13: 2 via RESPIRATORY_TRACT
  Filled 2023-07-13: qty 6.7

## 2023-07-13 MED ORDER — PREDNISONE 20 MG PO TABS: 40.0000 mg | ORAL_TABLET | Freq: Every day | ORAL | 0 refills | Status: DC

## 2023-07-13 MED ORDER — BREZTRI AEROSPHERE 160-9-4.8 MCG/ACT IN AERO: 2.0000 | INHALATION_SPRAY | Freq: Two times a day (BID) | RESPIRATORY_TRACT | 0 refills | Status: DC

## 2023-07-13 MED ORDER — AEROCHAMBER Z-STAT PLUS/MEDIUM MISC
1.0000 | Freq: Once | Status: AC
  Administered 2023-07-13: 1

## 2023-07-13 MED ORDER — AZITHROMYCIN 250 MG PO TABS: 250.0000 mg | ORAL_TABLET | Freq: Every day | ORAL | 0 refills | Status: DC

## 2023-07-13 MED ORDER — ALBUTEROL SULFATE HFA 108 (90 BASE) MCG/ACT IN AERS: 2.0000 | INHALATION_SPRAY | RESPIRATORY_TRACT | 1 refills | Status: DC | PRN

## 2023-07-13 NOTE — ED Provider Notes (Signed)
[place] EMERGENCY DEPARTMENT AT [facility] Provider Note   CSN: [id number] Arrival date & time: 07/13/23  2133     History  Chief Complaint  Patient presents with   Cough    Jacqueline [name] is a 22 y.o. female.   Cough  This patient is a 22 year old female, she has chronic respiratory asthma and is prescribed Breztri as well as albuterol, she has been out of both of these medications, she was seen about a month ago at the urgent care and treated with prednisone and albuterol, she reports that she did not continue to take this prednisone because she states it does not work for her anymore and reports that she was told by her allergist and asthma doctor that this was no longer could be helpful for her.  That being said for the last 2 weeks she has had increasing coughing, wheezing, mucus production, no fevers or chills, no chest pain, no swelling of the legs.  She reports that she lives by herself, she does not work, she is not around others, she has not been traveling.  She has no fevers or chills no nausea or vomiting no diarrhea and no swelling of the legs.    Home Medications Prior to Admission medications   Medication Sig Start Date End Date Taking? Authorizing Provider  azithromycin (ZITHROMAX Z-PAK) 250 MG tablet Take 1 tablet (250 mg total) by mouth daily. 500mg  PO day 1, then 250mg  PO days 205 07/13/23  Yes Eber Hong, MD  acetaminophen (TYLENOL) 500 MG tablet Take 1 tablet (500 mg total) by mouth every 6 (six) hours as needed. 03/05/23   Fayrene Helper, PA-C  albuterol (VENTOLIN HFA) 108 (90 Base) MCG/ACT inhaler Inhale 2 puffs into the lungs every 4 (four) hours as needed for wheezing or shortness of breath. 07/13/23   Eber Hong, MD  BREZTRI AEROSPHERE 160-9-4.8 MCG/ACT AERO Inhale 2 puffs into the lungs in the morning and at bedtime. 07/13/23   Eber Hong, MD  EPIPEN 2-PAK 0.3 MG/0.3ML SOAJ injection Use as directed for life threatening allergic  reactions 02/06/23   Marcelyn Bruins, MD  predniSONE (DELTASONE) 20 MG tablet Take 2 tablets (40 mg total) by mouth daily with breakfast. 07/13/23   Eber Hong, MD      Allergies    Bee venom, Egg-derived products, Other, Peanut-containing drug products, and Pollen extract    Review of Systems   Review of Systems  Respiratory:  Positive for cough.   All other systems reviewed and are negative.   Physical Exam Updated Vital Signs BP 118/74   Pulse 96   Temp 99.2 F (37.3 C) (Oral)   Resp 18   Ht 1.575 m (5\' 2" )   Wt 84.8 kg   LMP 07/06/2023 (Exact Date)   SpO2 97%   BMI 34.20 kg/m  Physical Exam Vitals and nursing note reviewed.  Constitutional:      General: She is not in acute distress.    Appearance: She is well-developed.  HENT:     Head: Normocephalic and atraumatic.     Mouth/Throat:     Pharynx: No oropharyngeal exudate.  Eyes:     General: No scleral icterus.       Right eye: No discharge.        Left eye: No discharge.     Conjunctiva/sclera: Conjunctivae normal.     Pupils: Pupils are equal, round, and reactive to light.  Neck:     Thyroid: No thyromegaly.  Vascular: No JVD.  Cardiovascular:     Rate and Rhythm: Normal rate and regular rhythm.     Heart sounds: Normal heart sounds. No murmur heard.    No friction rub. No gallop.  Pulmonary:     Effort: Pulmonary effort is normal. No respiratory distress.     Breath sounds: Wheezing present. No rales.  Abdominal:     General: Bowel sounds are normal. There is no distension.     Palpations: Abdomen is soft. There is no mass.     Tenderness: There is no abdominal tenderness.  Musculoskeletal:        General: No tenderness. Normal range of motion.     Cervical back: Normal range of motion and neck supple.     Right lower leg: No edema.     Left lower leg: No edema.  Lymphadenopathy:     Cervical: No cervical adenopathy.  Skin:    General: Skin is warm and dry.     Findings: No  erythema or rash.  Neurological:     General: No focal deficit present.     Mental Status: She is alert.     Coordination: Coordination normal.  Psychiatric:        Behavior: Behavior normal.     ED Results / Procedures / Treatments   Labs (all labs ordered are listed, but only abnormal results are displayed) Labs Reviewed - No data to display  EKG None  Radiology No results found.  Procedures Procedures    Medications Ordered in ED Medications  albuterol (VENTOLIN HFA) 108 (90 Base) MCG/ACT inhaler 2 puff (has no administration in time range)  aerochamber Z-Stat Plus/medium 1 each (has no administration in time range)    ED Course/ Medical Decision Making/ A&P                                 Medical Decision Making Risk Prescription drug management.   Patient is not in distress, she is not hypoxic, she is not febrile, she is not tachycardic, she does have some wheezing but speaks in full sentences.  I counseled with the patient about her tobacco use, about her overuse of albuterol and her insufficient use of oral steroids but she refuses to take oral steroids.  Will refill albuterol and her daily asthma medications, she is agreeable to this plan, she is not febrile and has no rales, she is not have any chest pain, this does not sound like pneumonia.  Due to the worsening cough over 2 weeks with phlegm production will trial case of Zithromax        Final Clinical Impression(s) / ED Diagnoses Final diagnoses:  Moderate persistent asthma with exacerbation  Acute cough    Rx / DC Orders ED Discharge Orders          Ordered    albuterol (VENTOLIN HFA) 108 (90 Base) MCG/ACT inhaler  Every 4 hours PRN        07/13/23 2200    BREZTRI AEROSPHERE 160-9-4.8 MCG/ACT AERO  2 times daily       Note to Pharmacy: Please use attached copay card ID [id number], GRP [id number], PCN [id number], BIN [id number]   07/13/23 2200    predniSONE (DELTASONE) 20 MG tablet  Daily with breakfast         07/13/23 2200    azithromycin (ZITHROMAX Z-PAK) 250 MG tablet  Daily  07/13/23 2200              Eber Hong, MD 07/13/23 2201

## 2023-07-13 NOTE — Discharge Instructions (Signed)
I have refilled your home medications, I strongly recommend that you use the albuterol and the prednisone as follows  Albuterol is an inhaled medication which can help you to breathe better, you should take 2 puffs every 4 hours as needed, this may cause your heart to feel like it is racing, this should be a temporary side effect.   Prednisone is a steroid that helps to reduce certain types of inflammation and may be used for allergic reactions, some rashes such as poison ivy or dermatitis, for asthma attacks or bronchitis and for certain types of pain.  Please take this medicine exactly as prescribed - 40mg  by mouth daily for 5 days.  This can have certain side effects with some people including feeling like you can't sleep, feeling anxious or feeling like you are on a "high".  It should not cause weight gain if only taken for a short time.  Please be aware that this medication may also cause an elevation in your blood sugar if you are a diabetic so if you are a diabetic you will need to keep a very close eye on your blood sugar, make sure that you are eating an extremely low level of carbohydrates and taking your medications exactly as prescribed.  If you should develop severe high blood sugar or start to feel poorly return to the emergency department immediately   Zithromax daily for the next 5 days to treat for potential bacterial infection

## 2023-07-13 NOTE — ED Triage Notes (Signed)
Pt to ED stating she has been sick for a week with a cough. Has taken mucinex at home without relief. Hx of asthma. Difficulty breathing and worse while lying.

## 2023-08-12 ENCOUNTER — Encounter: Payer: Self-pay | Admitting: Emergency Medicine

## 2023-08-12 ENCOUNTER — Ambulatory Visit
Admission: EM | Admit: 2023-08-12 | Discharge: 2023-08-12 | Disposition: A | Discharge status: Home / Self Care | Payer: Federal, State, Local not specified - PPO | Attending: Nurse Practitioner | Admitting: Nurse Practitioner

## 2023-08-12 DIAGNOSIS — N898 Other specified noninflammatory disorders of vagina: Secondary | ICD-10-CM | POA: Insufficient documentation

## 2023-08-12 DIAGNOSIS — J4551 Severe persistent asthma with (acute) exacerbation: Secondary | ICD-10-CM | POA: Diagnosis present

## 2023-08-12 DIAGNOSIS — Z113 Encounter for screening for infections with a predominantly sexual mode of transmission: Secondary | ICD-10-CM | POA: Insufficient documentation

## 2023-08-12 LAB — POC COVID19/FLU A&B COMBO
Covid Antigen, POC: NEGATIVE
Influenza A Antigen, POC: NEGATIVE
Influenza B Antigen, POC: NEGATIVE

## 2023-08-12 MED ORDER — DEXAMETHASONE SODIUM PHOSPHATE 10 MG/ML IJ SOLN
10.0000 mg | Freq: Once | INTRAMUSCULAR | Status: AC
  Administered 2023-08-12: 10 mg via INTRAMUSCULAR

## 2023-08-12 MED ORDER — ALBUTEROL SULFATE HFA 108 (90 BASE) MCG/ACT IN AERS: 2.0000 | INHALATION_SPRAY | RESPIRATORY_TRACT | 0 refills | Status: DC | PRN

## 2023-08-12 MED ORDER — ALBUTEROL SULFATE (2.5 MG/3ML) 0.083% IN NEBU: 2.5000 mg | INHALATION_SOLUTION | Freq: Four times a day (QID) | RESPIRATORY_TRACT | 0 refills | Status: DC | PRN

## 2023-08-12 MED ORDER — BREZTRI AEROSPHERE 160-9-4.8 MCG/ACT IN AERO: 2.0000 | INHALATION_SPRAY | Freq: Two times a day (BID) | RESPIRATORY_TRACT | 0 refills | Status: DC

## 2023-08-12 NOTE — Discharge Instructions (Addendum)
You have a viral upper respiratory infection that is causing your asthma to flare up.  We gave you an injection of Decadron today to help with lung inflammation.  Resume your Markus Daft twice daily and continue albuterol inhaler OR nebulizer every 4-6 hours as needed for wheezing or shortness of breath. It is imperative for you to follow up closely with a Pulmonologist; I have provided contact information for you to call and schedule an appointment.  COVID-19 and influenza test is negative.    Some things that can make you feel better are: - Increased rest - Increasing fluid with water/sugar free electrolytes - Acetaminophen and ibuprofen as needed for fever/pain - Salt water gargling, chloraseptic spray and throat lozenges - OTC guaifenesin (Mucinex) 600 mg twice daily for congestion - Saline sinus flushes or a neti pot for congestion - Humidifying the air  We will contact you if the blood work or vaginal testing comes back positive for anything in the next couple of days.  Will prescribe any medicine needed at that time.

## 2023-08-12 NOTE — ED Triage Notes (Signed)
Productive cough x 2 days, c/o body aches and nasal congestion.  States she is out of her albuterol breztri medications

## 2023-08-12 NOTE — ED Provider Notes (Signed)
[facility]    CSN: [id number] Arrival date & time: 08/12/23  1357      History   Chief Complaint Chief Complaint  Patient presents with   Cough   Nasal Congestion    HPI Jacqueline [name] is a 23 y.o. female.   Patient presents today with 2-day history of bodyaches, congested cough, wheezing, chest tightness, stuffy nose, sore throat that is now improved, abdominal pain from coughing, decreased appetite, and fatigue.  No known fevers, shortness of breath, chest pain, headache, ear pain, vomiting, or diarrhea.  No known sick contacts.  Reports history of asthma, is out of Breztri and rescue inhaler.  Has not seen asthma doctor in approximately 6 months, reports having trouble getting to appointments currently.  Patient also concerned for possible pH imbalance or concern for STI.  Reports she has had some intermittent lower abdominal pain vaginal irritation that is now improved.  Also endorses fishy odor to her genital area but denies vaginal discharge, burning with urination, or increased urinary frequency.  No vaginal rashes, sores, or lesions.  No known exposures to STI.    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   Severe persistent asthma without complication 01/28/2020   Migraine without aura and without status migrainosus, not intractable 10/26/2018   Anxiety and depression    Asthma, not well controlled, severe persistent, with acute exacerbation 04/27/2018   Self-injurious behavior 04/04/2018   MDD (major depressive disorder), recurrent, severe, 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   Acute  respiratory failure, unsp w hypoxia or hypercapnia (HCC) 01/09/2017    Past Surgical History:  Procedure Laterality Date   GANGLION CYST EXCISION Right 04/26/2021   Procedure: right dorsal carpal ganglion cyst excision;  Surgeon: Gomez Cleverly, MD;  Location: [facility] OR;  Service: Orthopedics;  Laterality: Right;  with MAC anesthesia needs 30 minutes   INCISION AND DRAINAGE WOUND WITH NAILBED REPAIR Left 04/26/2021   Procedure: INCISION AND DRAINAGE WOUND WITH NAILBED REPAIR LEFT MIDDLE FINGER;  Surgeon: Gomez Cleverly, MD;  Location: [facility] OR;  Service: Orthopedics;  Laterality: Left;   UMBILICAL HERNIA REPAIR      OB History   No obstetric history on file.      Home Medications    Prior to Admission medications   Medication Sig Start Date End Date Taking? Authorizing Provider  albuterol (PROVENTIL) (2.5 MG/3ML) 0.083% nebulizer solution Take 3 mLs (2.5 mg total) by nebulization every 6 (six) hours as needed for wheezing or shortness of breath. 08/12/23  Yes Cathlean Marseilles A, NP  acetaminophen (TYLENOL) 500 MG tablet Take 1 tablet (500 mg total) by mouth every 6 (six) hours as needed. 03/05/23   Fayrene Helper, PA-C  albuterol (VENTOLIN HFA) 108 (90 Base) MCG/ACT inhaler Inhale 2 puffs into the lungs every 4 (four) hours as needed for wheezing or shortness of breath. 08/12/23   Valentino Nose, NP  BREZTRI AEROSPHERE 160-9-4.8 MCG/ACT AERO Inhale 2 puffs into the lungs in the morning and at bedtime. 08/12/23   Valentino Nose, NP  EPIPEN 2-PAK 0.3 MG/0.3ML  SOAJ injection Use as directed for life threatening allergic reactions 02/06/23   Marcelyn Bruins, MD    Family History Family History  Problem Relation Age of Onset   Allergic rhinitis Mother    Asthma Mother    COPD Mother    Migraines Mother    Allergic rhinitis Father    Asthma Father    Schizophrenia Father    Asthma Sister    Asthma Brother    Migraines Brother     Social History Social History   Tobacco Use    Smoking status: Some Days    Current packs/day: 0.00    Average packs/day: 0.3 packs/day for 4.0 years (1.0 ttl pk-yrs)    Types: Cigarettes, Cigars    Start date: 11/06/2015    Last attempt to quit: 11/06/2019    Years since quitting: 3.7   Smokeless tobacco: Never   Tobacco comments:    Patient smokes black n milds  Vaping Use   Vaping status: Never Used  Substance Use Topics   Alcohol use: Not Currently    Comment: has a drink occasionally   Drug use: Not Currently    Types: Marijuana    Comment: rarely     Allergies   Bee venom, Egg-derived products, Other, Peanut-containing drug products, and Pollen extract   Review of Systems Review of Systems Per HPI  Physical Exam Triage Vital Signs ED Triage Vitals  Encounter Vitals Group     BP 08/12/23 1430 (!) 138/92     Systolic BP Percentile --      Diastolic BP Percentile --      Pulse Rate 08/12/23 1430 96     Resp 08/12/23 1430 20     Temp 08/12/23 1430 99.2 F (37.3 C)     Temp Source 08/12/23 1430 Oral     SpO2 08/12/23 1430 91 %     Weight --      Height --      Head Circumference --      Peak Flow --      Pain Score 08/12/23 1431 8     Pain Loc --      Pain Education --      Exclude from Growth Chart --    No data found.  Updated Vital Signs BP (!) 138/92 (BP Location: Right Arm)   Pulse 96   Temp 99.2 F (37.3 C) (Oral)   Resp 20   LMP 08/05/2023 (Exact Date)   SpO2 91%   Visual Acuity Right Eye Distance:   Left Eye Distance:   Bilateral Distance:    Right Eye Near:   Left Eye Near:    Bilateral Near:     Physical Exam Vitals and nursing note reviewed.  Constitutional:      General: She is not in acute distress.    Appearance: Normal appearance. She is not ill-appearing or toxic-appearing.  HENT:     Head: Normocephalic and atraumatic.     Right Ear: Tympanic membrane, ear canal and external ear normal.     Left Ear: Tympanic membrane, ear canal and external ear normal.     Nose:  No congestion or rhinorrhea.     Mouth/Throat:     Mouth: Mucous membranes are moist.     Pharynx: Oropharynx is clear. No oropharyngeal exudate or posterior oropharyngeal erythema.  Eyes:     General: No scleral icterus.    Extraocular Movements: Extraocular movements intact.  Cardiovascular:     Rate and Rhythm:  Normal rate and regular rhythm.  Pulmonary:     Effort: Pulmonary effort is normal. No respiratory distress.     Breath sounds: Wheezing present. No rhonchi or rales.  Musculoskeletal:     Cervical back: Normal range of motion and neck supple.  Lymphadenopathy:     Cervical: No cervical adenopathy.  Skin:    General: Skin is warm and dry.     Coloration: Skin is not jaundiced or pale.     Findings: No erythema or rash.  Neurological:     Mental Status: She is alert and oriented to person, place, and time.  Psychiatric:        Behavior: Behavior is cooperative.      UC Treatments / Results  Labs (all labs ordered are listed, but only abnormal results are displayed) Labs Reviewed  RPR  HIV ANTIBODY (ROUTINE TESTING W REFLEX)  POC COVID19/FLU A&B COMBO  CERVICOVAGINAL ANCILLARY ONLY    EKG   Radiology No results found.  Procedures Procedures (including critical care time)  Medications Ordered in UC Medications  dexamethasone (DECADRON) injection 10 mg (10 mg Intramuscular Given 08/12/23 1522)    Initial Impression / Assessment and Plan / UC Course  I have reviewed the triage vital signs and the nursing notes.  Pertinent labs & imaging results that were available during my care of the patient were reviewed by me and considered in my medical decision making (see chart for details).   Patient is well-appearing, normotensive, afebrile, not tachycardic, not tachypneic, oxygenating well on room air.    1. Screening examination for STD (sexually transmitted disease) Self swab cytology pending-treat as indicated HIV and syphilis testing also  obtained Recommended condom use with every sexual encounter  2. Severe persistent asthma with acute exacerbation Discussed importance of following up with specialist/primary care to adjust medicines; patient has not started infusion due to insurance issues as prescribed in July 2024 and is overdue for follow-up In meantime, will treat for exacerbation with Decadron 10 mg IM per patient's preference over oral prednisone, increase frequency of albuterol inhaler or nebulizer every 4-6 hours, resume Breztri Suspect secondary to viral illness - COVID-19 and influenza is negative Other supportive care discussed including cough suppressant medication ER and return precautions discussed Work excuse provided  The patient was given the opportunity to ask questions.  All questions answered to their satisfaction.  The patient is in agreement to this plan.    Final Clinical Impressions(s) / UC Diagnoses   Final diagnoses:  Screening examination for STD (sexually transmitted disease)  Severe persistent asthma with acute exacerbation     Discharge Instructions      You have a viral upper respiratory infection that is causing your asthma to flare up.  We gave you an injection of Decadron today to help with lung inflammation.  Resume your Markus Daft twice daily and continue albuterol inhaler OR nebulizer every 4-6 hours as needed for wheezing or shortness of breath. It is imperative for you to follow up closely with a Pulmonologist; I have provided contact information for you to call and schedule an appointment.  COVID-19 and influenza test is negative.    Some things that can make you feel better are: - Increased rest - Increasing fluid with water/sugar free electrolytes - Acetaminophen and ibuprofen as needed for fever/pain - Salt water gargling, chloraseptic spray and throat lozenges - OTC guaifenesin (Mucinex) 600 mg twice daily for congestion - Saline sinus flushes or a neti pot for congestion -  Humidifying the air  We will contact you if the blood work or vaginal testing comes back positive for anything in the next couple of days.  Will prescribe any medicine needed at that time.      ED Prescriptions     Medication Sig Dispense Auth. Provider   BREZTRI AEROSPHERE 160-9-4.8 MCG/ACT AERO Inhale 2 puffs into the lungs in the morning and at bedtime. 10.7 g Cathlean Marseilles A, NP   albuterol (VENTOLIN HFA) 108 (90 Base) MCG/ACT inhaler Inhale 2 puffs into the lungs every 4 (four) hours as needed for wheezing or shortness of breath. 18 g Cathlean Marseilles A, NP   albuterol (PROVENTIL) (2.5 MG/3ML) 0.083% nebulizer solution Take 3 mLs (2.5 mg total) by nebulization every 6 (six) hours as needed for wheezing or shortness of breath. 75 mL Valentino Nose, NP      PDMP not reviewed this encounter.   Valentino Nose, NP 08/12/23 1739

## 2023-08-13 ENCOUNTER — Telehealth (HOSPITAL_COMMUNITY): Payer: Self-pay

## 2023-08-13 LAB — CERVICOVAGINAL ANCILLARY ONLY
Bacterial Vaginitis (gardnerella): POSITIVE — AB
Candida Glabrata: NEGATIVE
Candida Vaginitis: NEGATIVE
Chlamydia: NEGATIVE
Comment: NEGATIVE
Comment: NEGATIVE
Comment: NEGATIVE
Comment: NEGATIVE
Comment: NEGATIVE
Comment: NORMAL
Neisseria Gonorrhea: NEGATIVE
Trichomonas: POSITIVE — AB

## 2023-08-13 LAB — HIV ANTIBODY (ROUTINE TESTING W REFLEX): HIV Screen 4th Generation wRfx: NONREACTIVE

## 2023-08-13 LAB — RPR: RPR Ser Ql: NONREACTIVE

## 2023-08-13 MED ORDER — METRONIDAZOLE 500 MG PO TABS: 500.0000 mg | ORAL_TABLET | Freq: Two times a day (BID) | ORAL | 0 refills | Status: AC

## 2023-08-13 NOTE — Telephone Encounter (Signed)
Per protocol, pt requires tx with metronidazole. Attempted to reach patient x1. Pt's mother answered. States pt does not have separate line to be called on and does not live with her. No information was shared. States she will give pt this number to call back. Rx sent to pharmacy on file.

## 2023-08-19 ENCOUNTER — Encounter: Payer: Self-pay | Admitting: *Deleted

## 2023-09-09 ENCOUNTER — Other Ambulatory Visit: Payer: Self-pay | Admitting: Nurse Practitioner

## 2023-09-09 NOTE — Telephone Encounter (Signed)
 Requested Prescriptions  Pending Prescriptions Disp Refills   albuterol (VENTOLIN HFA) 108 (90 Base) MCG/ACT inhaler [Pharmacy Med Name: ALBUTEROL HFA (VENTOLIN) INH] 18 each     Sig: INHALE 2 PUFFS INTO THE LUNGS EVERY 4 HOURS AS NEEDED FOR WHEEZING OR SHORTNESS OF BREATH.     Pulmonology:  Beta Agonists 2 Failed - 09/09/2023 11:12 AM      Failed - Last BP in normal range    BP Readings from Last 1 Encounters:  08/12/23 (!) 138/92         Failed - Valid encounter within last 12 months    Recent Outpatient Visits   None            Passed - Last Heart Rate in normal range    Pulse Readings from Last 1 Encounters:  08/12/23 96

## 2023-09-28 ENCOUNTER — Ambulatory Visit
Admission: EM | Admit: 2023-09-28 | Discharge: 2023-09-28 | Disposition: A | Discharge status: Home / Self Care | Attending: Family Medicine | Admitting: Family Medicine

## 2023-09-28 ENCOUNTER — Other Ambulatory Visit: Payer: Self-pay

## 2023-09-28 ENCOUNTER — Encounter: Payer: Self-pay | Admitting: Emergency Medicine

## 2023-09-28 DIAGNOSIS — Z113 Encounter for screening for infections with a predominantly sexual mode of transmission: Secondary | ICD-10-CM | POA: Diagnosis not present

## 2023-09-28 DIAGNOSIS — J4551 Severe persistent asthma with (acute) exacerbation: Secondary | ICD-10-CM

## 2023-09-28 DIAGNOSIS — A5901 Trichomonal vulvovaginitis: Secondary | ICD-10-CM | POA: Diagnosis present

## 2023-09-28 DIAGNOSIS — R059 Cough, unspecified: Secondary | ICD-10-CM | POA: Diagnosis present

## 2023-09-28 DIAGNOSIS — N760 Acute vaginitis: Secondary | ICD-10-CM | POA: Diagnosis present

## 2023-09-28 DIAGNOSIS — Z8619 Personal history of other infectious and parasitic diseases: Secondary | ICD-10-CM

## 2023-09-28 LAB — POC COVID19/FLU A&B COMBO
Covid Antigen, POC: NEGATIVE
Influenza A Antigen, POC: NEGATIVE
Influenza B Antigen, POC: NEGATIVE

## 2023-09-28 MED ORDER — BREZTRI AEROSPHERE 160-9-4.8 MCG/ACT IN AERO: 2.0000 | INHALATION_SPRAY | Freq: Two times a day (BID) | RESPIRATORY_TRACT | 2 refills | Status: DC

## 2023-09-28 MED ORDER — DEXAMETHASONE SODIUM PHOSPHATE 10 MG/ML IJ SOLN
10.0000 mg | Freq: Once | INTRAMUSCULAR | Status: AC
  Administered 2023-09-28: 10 mg via INTRAMUSCULAR

## 2023-09-28 MED ORDER — ALBUTEROL SULFATE HFA 108 (90 BASE) MCG/ACT IN AERS: 2.0000 | INHALATION_SPRAY | RESPIRATORY_TRACT | 0 refills | Status: DC | PRN

## 2023-09-28 NOTE — ED Triage Notes (Addendum)
 Pt reports hx of asthma and reports "want to make sure its not a flare-up due to covid or anything." Pt reports runny nose, chills for last several days.   Pt also reported "I would like to make sure BV and Trich are gone." "I'm completely fine now but wanted to re-check while I was here."

## 2023-09-29 LAB — CERVICOVAGINAL ANCILLARY ONLY
Bacterial Vaginitis (gardnerella): NEGATIVE
Comment: NEGATIVE
Comment: NEGATIVE
Trichomonas: POSITIVE — AB

## 2023-09-30 ENCOUNTER — Telehealth (HOSPITAL_COMMUNITY): Payer: Self-pay

## 2023-09-30 MED ORDER — METRONIDAZOLE 500 MG PO TABS: 500.0000 mg | ORAL_TABLET | Freq: Two times a day (BID) | ORAL | 0 refills | Status: AC

## 2023-09-30 NOTE — Telephone Encounter (Signed)
 Per protocol, pt requires tx with metronidazole. Rx sent to pharmacy on file.

## 2023-10-01 NOTE — ED Provider Notes (Signed)
 [facility]    CSN: [id number] Arrival date & time: 09/28/23  1113      History   Chief Complaint Chief Complaint  Patient presents with   Cough    HPI Jacqueline [name] is a 23 y.o. female.   Patient presenting today with several day history of runny nose, chills, cough, chest tightness, wheezing, shortness of breath.  Denies chest pain, severe shortness of breath, abdominal pain, vomiting, diarrhea.  Needs refills on her inhalers.  Wanted to make sure she does not have COVID or anything else viral.  She is also requesting a recheck as she was recently treated for BV and trichomoniasis.  Denies any known new exposures, symptoms at this time.    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   Severe persistent asthma without complication 01/28/2020   Migraine without aura and without status migrainosus, not intractable 10/26/2018   Anxiety and depression    Asthma, not well controlled, severe persistent, with acute exacerbation 04/27/2018   Self-injurious behavior 04/04/2018   MDD (major depressive disorder), recurrent, severe, 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   Acute respiratory failure, unsp w hypoxia or hypercapnia (HCC) 01/09/2017    Past Surgical History:  Procedure Laterality Date   GANGLION CYST EXCISION Right 04/26/2021   Procedure: right dorsal carpal ganglion cyst excision;  Surgeon: Gomez Cleverly, MD;  Location: [facility] OR;  Service: Orthopedics;  Laterality: Right;  with MAC anesthesia needs 30 minutes   INCISION AND DRAINAGE WOUND WITH NAILBED REPAIR Left 04/26/2021   Procedure: INCISION  AND DRAINAGE WOUND WITH NAILBED REPAIR LEFT MIDDLE FINGER;  Surgeon: Gomez Cleverly, MD;  Location: [facility] OR;  Service: Orthopedics;  Laterality: Left;   UMBILICAL HERNIA REPAIR      OB History   No obstetric history on file.      Home Medications    Prior to Admission medications   Medication Sig Start Date End Date Taking? Authorizing Provider  acetaminophen (TYLENOL) 500 MG tablet Take 1 tablet (500 mg total) by mouth every 6 (six) hours as needed. 03/05/23   Fayrene Helper, PA-C  albuterol (PROVENTIL) (2.5 MG/3ML) 0.083% nebulizer solution Take 3 mLs (2.5 mg total) by nebulization every 6 (six) hours as needed for wheezing or shortness of breath. 08/12/23   Valentino Nose, NP  albuterol (VENTOLIN HFA) 108 (90 Base) MCG/ACT inhaler Inhale 2 puffs into the lungs every 4 (four) hours as needed for wheezing or shortness of breath. 09/28/23   Particia Nearing, PA-C  BREZTRI AEROSPHERE 160-9-4.8 MCG/ACT AERO Inhale 2 puffs into the lungs in the morning and at bedtime. 09/28/23   Particia Nearing, PA-C  EPIPEN 2-PAK 0.3 MG/0.3ML SOAJ injection Use as directed for life threatening allergic reactions 02/06/23   Marcelyn Bruins, MD  metroNIDAZOLE (FLAGYL) 500 MG tablet Take 1 tablet (500 mg total) by mouth 2 (two) times daily for 7 days. 09/30/23 10/07/23  Zenia Resides, MD    Family History Family History  Problem Relation Age of Onset   Allergic rhinitis Mother    Asthma  Mother    COPD Mother    Migraines Mother    Allergic rhinitis Father    Asthma Father    Schizophrenia Father    Asthma Sister    Asthma Brother    Migraines Brother     Social History Social History   Tobacco Use   Smoking status: Some Days    Current packs/day: 0.00    Average packs/day: 0.3 packs/day for 4.0 years (1.0 ttl pk-yrs)    Types: Cigarettes, Cigars    Start date: 11/06/2015    Last attempt to quit: 11/06/2019    Years since quitting: 3.9   Smokeless tobacco: Never   Tobacco  comments:    Patient smokes black n milds  Vaping Use   Vaping status: Never Used  Substance Use Topics   Alcohol use: Not Currently    Comment: has a drink occasionally   Drug use: Not Currently    Types: Marijuana    Comment: rarely     Allergies   Bee venom, Egg-derived products, Other, Peanut-containing drug products, and Pollen extract   Review of Systems Review of Systems Per HPI  Physical Exam Triage Vital Signs ED Triage Vitals [09/28/23 1122]  Encounter Vitals Group     BP 131/87     Systolic BP Percentile      Diastolic BP Percentile      Pulse Rate 80     Resp 20     Temp 98.8 F (37.1 C)     Temp Source Oral     SpO2 93 %     Weight      Height      Head Circumference      Peak Flow      Pain Score 0     Pain Loc      Pain Education      Exclude from Growth Chart    No data found.  Updated Vital Signs BP 131/87 (BP Location: Right Arm)   Pulse 80   Temp 98.8 F (37.1 C) (Oral)   Resp 20   LMP 09/07/2023   SpO2 93%   Visual Acuity Right Eye Distance:   Left Eye Distance:   Bilateral Distance:    Right Eye Near:   Left Eye Near:    Bilateral Near:     Physical Exam Vitals and nursing note reviewed.  Constitutional:      Appearance: Normal appearance.  HENT:     Head: Atraumatic.     Right Ear: Tympanic membrane and external ear normal.     Left Ear: Tympanic membrane and external ear normal.     Nose: Rhinorrhea present.     Mouth/Throat:     Mouth: Mucous membranes are moist.     Pharynx: Posterior oropharyngeal erythema present.  Eyes:     Extraocular Movements: Extraocular movements intact.     Conjunctiva/sclera: Conjunctivae normal.  Cardiovascular:     Rate and Rhythm: Normal rate and regular rhythm.     Heart sounds: Normal heart sounds.  Pulmonary:     Effort: Pulmonary effort is normal.     Breath sounds: Wheezing present. No rales.  Musculoskeletal:        General: Normal range of motion.     Cervical back:  Normal range of motion and neck supple.  Skin:    General: Skin is warm and dry.  Neurological:     Mental Status: She is alert and oriented to person, place, and time.  Psychiatric:  Mood and Affect: Mood normal.        Thought Content: Thought content normal.      UC Treatments / Results  Labs (all labs ordered are listed, but only abnormal results are displayed) Labs Reviewed  CERVICOVAGINAL ANCILLARY ONLY - Abnormal; Notable for the following components:      Result Value   Trichomonas Positive (*)    All other components within normal limits  POC COVID19/FLU A&B COMBO    EKG   Radiology No results found.  Procedures Procedures (including critical care time)  Medications Ordered in UC Medications  dexamethasone (DECADRON) injection 10 mg (10 mg Intramuscular Given 09/28/23 1201)    Initial Impression / Assessment and Plan / UC Course  I have reviewed the triage vital signs and the nursing notes.  Pertinent labs & imaging results that were available during my care of the patient were reviewed by me and considered in my medical decision making (see chart for details).     Vitals and exam overall reassuring, consistent with asthma exacerbation which unclear if secondary to viral illness or allergic exacerbation.  Will refill inhalers, discussed allergy regimen, supportive over-the-counter medications and home care.  She is requesting a recheck for BV and trichomonas after completing Flagyl.  Vaginal swab pending, adjust if needed.  Final Clinical Impressions(s) / UC Diagnoses   Final diagnoses:  Severe persistent asthma with acute exacerbation  History of trichomoniasis   Discharge Instructions   None    ED Prescriptions     Medication Sig Dispense Auth. Provider   albuterol (VENTOLIN HFA) 108 (90 Base) MCG/ACT inhaler Inhale 2 puffs into the lungs every 4 (four) hours as needed for wheezing or shortness of breath. 18 g Roosvelt Maser [place], [place]    BREZTRI AEROSPHERE 160-9-4.8 MCG/ACT AERO Inhale 2 puffs into the lungs in the morning and at bedtime. 10.7 g Particia Nearing, [place]      PDMP not reviewed this encounter.   Particia Nearing, [place] 10/01/23 1812

## 2023-10-12 ENCOUNTER — Emergency Department (HOSPITAL_BASED_OUTPATIENT_CLINIC_OR_DEPARTMENT_OTHER)
Admission: EM | Admit: 2023-10-12 | Discharge: 2023-10-12 | Disposition: A | Discharge status: Home / Self Care | Attending: Emergency Medicine | Admitting: Emergency Medicine

## 2023-10-12 ENCOUNTER — Other Ambulatory Visit: Payer: Self-pay

## 2023-10-12 DIAGNOSIS — Z8619 Personal history of other infectious and parasitic diseases: Secondary | ICD-10-CM | POA: Diagnosis not present

## 2023-10-12 DIAGNOSIS — R0789 Other chest pain: Secondary | ICD-10-CM | POA: Diagnosis present

## 2023-10-12 DIAGNOSIS — Z91010 Allergy to peanuts: Secondary | ICD-10-CM | POA: Insufficient documentation

## 2023-10-12 DIAGNOSIS — J4531 Mild persistent asthma with (acute) exacerbation: Secondary | ICD-10-CM | POA: Diagnosis not present

## 2023-10-12 MED ORDER — ALBUTEROL SULFATE HFA 108 (90 BASE) MCG/ACT IN AERS: 2.0000 | INHALATION_SPRAY | RESPIRATORY_TRACT | 0 refills | Status: DC | PRN

## 2023-10-12 MED ORDER — DEXAMETHASONE 0.75 MG PO TABS: ORAL_TABLET | ORAL | 0 refills | Status: AC

## 2023-10-12 MED ORDER — ALBUTEROL SULFATE HFA 108 (90 BASE) MCG/ACT IN AERS
2.0000 | INHALATION_SPRAY | Freq: Once | RESPIRATORY_TRACT | Status: AC
  Administered 2023-10-12: 2 via RESPIRATORY_TRACT
  Filled 2023-10-12: qty 6.7

## 2023-10-12 MED ORDER — IPRATROPIUM-ALBUTEROL 0.5-2.5 (3) MG/3ML IN SOLN
3.0000 mL | Freq: Once | RESPIRATORY_TRACT | Status: AC
  Administered 2023-10-12: 3 mL via RESPIRATORY_TRACT
  Filled 2023-10-12: qty 3

## 2023-10-12 MED ORDER — PREDNISONE 10 MG PO TABS
60.0000 mg | ORAL_TABLET | Freq: Once | ORAL | Status: DC
  Filled 2023-10-12: qty 1

## 2023-10-12 MED ORDER — ALBUTEROL SULFATE (2.5 MG/3ML) 0.083% IN NEBU
INHALATION_SOLUTION | RESPIRATORY_TRACT | Status: AC
  Administered 2023-10-12: 5 mg
  Filled 2023-10-12: qty 6

## 2023-10-12 MED ORDER — DEXAMETHASONE 4 MG PO TABS
6.0000 mg | ORAL_TABLET | Freq: Once | ORAL | Status: AC
  Administered 2023-10-12: 6 mg via ORAL
  Filled 2023-10-12: qty 2

## 2023-10-12 MED ORDER — BREZTRI AEROSPHERE 160-9-4.8 MCG/ACT IN AERO: 2.0000 | INHALATION_SPRAY | Freq: Two times a day (BID) | RESPIRATORY_TRACT | 2 refills | Status: DC

## 2023-10-12 NOTE — ED Triage Notes (Signed)
 Feels as though asthma exacerbated by pollen x1 week. Ran out of inhaler at home. Cough, congestion. RTT to triage for eval and breathing treatment.  Positive for trich in Feb-took course of antibiotics-tested positive again-took second course and just finished- curious if she can be tested again. Symptoms resolved both times.

## 2023-10-12 NOTE — Discharge Instructions (Addendum)
 You were seen in the emergency department for your shortness of breath.  You were wheezing consistent with an asthma exacerbation.  I have given you refills of your inhalers and you should continue to use your albuterol every 4 hours for the next 24 hours and then can use as needed and you can take your Breztri inhaler daily as prescribed.  I have given you a Decadron taper that you should complete as prescribed.  You can follow-up with your primary doctor in the next few days to have your symptoms rechecked.  I would recommend waiting at least 1 month after completing her antibiotics for retesting of trichomonas and this can also be done by your primary doctor.  You can return to the emergency department if you having significantly worsening shortness of breath, severe chest pain or any other new or concerning symptoms.

## 2023-10-12 NOTE — ED Notes (Signed)
 RT called to triage to assess patient. Patient has history of asthma, has been struggling for a few days. Reports she is out of her MDI. BBS exp. Wheezes. Albuterol 5 mg given in triage. No atrovent due to allergy. Rt to monitor as needed

## 2023-10-12 NOTE — ED Provider Notes (Signed)
 [place] EMERGENCY DEPARTMENT AT [facility] Provider Note   CSN: [id number] Arrival date & time: 10/12/23  1513     History  No chief complaint on file.   Jacqueline [name] is a 23 y.o. female.  Patient is a 23 year old female with a past medical history of asthma presenting to the emergency department with chest tightness.  The patient states that she feels like her allergies have been acting up for the last week causing some chest tightness and shortness of breath.  She states that she has had a mild cough and occasionally will cough up mucus.  She states that her throat has been itchy.  She denies any congestion or fever.  She denies any known sick contacts.  She states that she has been out of her inhalers at home.  Patient also requests being retested for trichomonas.  She states that she was diagnosed with trichomonas in February and retested and was still positive and just finished antibiotics on Thursday.  She states that she is no longer having any discharge or symptoms.  She states that she has not been sexually active again with that previous partner.  The history is provided by the patient.       Home Medications Prior to Admission medications   Medication Sig Start Date End Date Taking? Authorizing Provider  dexamethasone (DECADRON) 0.75 MG tablet Take 2 tablets (1.5 mg total) by mouth 2 (two) times daily with a meal for 2 days, THEN 1 tablet (0.75 mg total) 2 (two) times daily with a meal for 2 days, THEN 1 tablet (0.75 mg total) daily for 2 days. 10/12/23 10/18/23 Yes Elayne Snare K, DO  acetaminophen (TYLENOL) 500 MG tablet Take 1 tablet (500 mg total) by mouth every 6 (six) hours as needed. 03/05/23   Fayrene Helper, PA-C  albuterol (PROVENTIL) (2.5 MG/3ML) 0.083% nebulizer solution Take 3 mLs (2.5 mg total) by nebulization every 6 (six) hours as needed for wheezing or shortness of breath. 08/12/23   Valentino Nose, NP  albuterol (VENTOLIN HFA) 108 (90  Base) MCG/ACT inhaler Inhale 2 puffs into the lungs every 4 (four) hours as needed for wheezing or shortness of breath. 10/12/23   Theresia Lo, Turkey K, DO  BREZTRI AEROSPHERE 160-9-4.8 MCG/ACT AERO Inhale 2 puffs into the lungs in the morning and at bedtime. 10/12/23   Rexford Maus, DO  EPIPEN 2-PAK 0.3 MG/0.3ML SOAJ injection Use as directed for life threatening allergic reactions 02/06/23   Marcelyn Bruins, MD      Allergies    Bee venom, Egg-derived products, Other, Peanut-containing drug products, and Pollen extract    Review of Systems   Review of Systems  Physical Exam Updated Vital Signs BP 135/80 (BP Location: Right Arm)   Pulse 91   Temp 98 F (36.7 C)   Resp 20   LMP 09/07/2023   SpO2 96%  Physical Exam Vitals and nursing note reviewed.  Constitutional:      General: She is not in acute distress.    Appearance: Normal appearance.  HENT:     Head: Normocephalic and atraumatic.     Nose: Nose normal.     Mouth/Throat:     Mouth: Mucous membranes are moist.     Pharynx: Oropharynx is clear.  Eyes:     Extraocular Movements: Extraocular movements intact.     Conjunctiva/sclera: Conjunctivae normal.  Cardiovascular:     Rate and Rhythm: Normal rate and regular rhythm.     Heart  sounds: Normal heart sounds.  Pulmonary:     Effort: Pulmonary effort is normal.     Breath sounds: Wheezing (diffuse expiratory wheze) present.  Abdominal:     General: Abdomen is flat.     Palpations: Abdomen is soft.     Tenderness: There is no abdominal tenderness.  Musculoskeletal:        General: Normal range of motion.     Cervical back: Normal range of motion.  Skin:    General: Skin is warm and dry.  Neurological:     General: No focal deficit present.     Mental Status: She is alert and oriented to person, place, and time.  Psychiatric:        Mood and Affect: Mood normal.        Behavior: Behavior normal.     ED Results / Procedures / Treatments    Labs (all labs ordered are listed, but only abnormal results are displayed) Labs Reviewed - No data to display  EKG None  Radiology No results found.  Procedures Procedures    Medications Ordered in ED Medications  albuterol (VENTOLIN HFA) 108 (90 Base) MCG/ACT inhaler 2 puff (has no administration in time range)  albuterol (PROVENTIL) (2.5 MG/3ML) 0.083% nebulizer solution (5 mg  Given 10/12/23 1529)  ipratropium-albuterol (DUONEB) 0.5-2.5 (3) MG/3ML nebulizer solution 3 mL (3 mLs Nebulization Given 10/12/23 1602)  dexamethasone (DECADRON) tablet 6 mg (6 mg Oral Given 10/12/23 1628)    ED Course/ Medical Decision Making/ A&P Clinical Course as of 10/12/23 1724  Sun Oct 12, 2023  1723 Upon reassessment, the patient's wheezing has resolved.  She is stable for discharge home with outpatient follow-up and was given strict return precautions. [VK]    Clinical Course User Index [VK] Rexford Maus, DO                                 Medical Decision Making This patient presents to the ED with chief complaint(s) of SOB with pertinent past medical history of asthma which further complicates the presenting complaint. The complaint involves an extensive differential diagnosis and also carries with it a high risk of complications and morbidity.    The differential diagnosis includes patient has wheezing and exam consistent with asthma exacerbation, no focal lung sounds and satting well on room air making pneumonia unlikely, no infectious symptoms making viral syndrome less likely  Additional history obtained: Additional history obtained from N/A Records reviewed urgent care records  ED Course and Reassessment: On patient's arrival she is hemodynamically stable in no acute distress.  Was evaluated by respiratory triage and was given albuterol neb.  The patient is still having decent wheezing in the room and will be given DuoNeb and steroids.  Her CDC guidelines tested here was not  recommended for trichomonas before 1 month and screening recommended at most of 43-month intervals and patient was informed that she is no longer symptomatic would be too early for retest at this time.  Recommended that she can follow-up with her primary doctor for retesting at the appropriate time window.  Independent labs interpretation:  N/A  Independent visualization of imaging: - N/A  Consultation: - Consulted or discussed management/test interpretation w/ external professional: N/A  Consideration for admission or further workup: Patient has no emergent conditions requiring admission or further work-up at this time and is stable for discharge home with primary care follow-up  Social Determinants of  health: no PCP    Risk Prescription drug management.          Final Clinical Impression(s) / ED Diagnoses Final diagnoses:  Mild persistent asthma with exacerbation  History of trichomoniasis    Rx / DC Orders ED Discharge Orders          Ordered    albuterol (VENTOLIN HFA) 108 (90 Base) MCG/ACT inhaler  Every 4 hours PRN        10/12/23 1722    BREZTRI AEROSPHERE 160-9-4.8 MCG/ACT AERO  2 times daily       Note to Pharmacy: Please use attached copay card ID [id number], GRP [id number], PCN [id number], BIN [id number]   10/12/23 1722    dexamethasone (DECADRON) 0.75 MG tablet  Multiple Frequencies        10/12/23 1722              Rexford Maus, DO 10/12/23 1724

## 2023-10-12 NOTE — ED Notes (Signed)
 MDI givent to patient to take home. Patient requested I give her a mask spacer. Instructions given.

## 2023-10-27 ENCOUNTER — Ambulatory Visit
Admission: EM | Admit: 2023-10-27 | Discharge: 2023-10-27 | Disposition: A | Discharge status: Home / Self Care | Attending: Nurse Practitioner | Admitting: Nurse Practitioner

## 2023-10-27 DIAGNOSIS — Z202 Contact with and (suspected) exposure to infections with a predominantly sexual mode of transmission: Secondary | ICD-10-CM | POA: Diagnosis present

## 2023-10-27 DIAGNOSIS — Z113 Encounter for screening for infections with a predominantly sexual mode of transmission: Secondary | ICD-10-CM | POA: Diagnosis not present

## 2023-10-27 DIAGNOSIS — L309 Dermatitis, unspecified: Secondary | ICD-10-CM | POA: Insufficient documentation

## 2023-10-27 MED ORDER — TRIAMCINOLONE ACETONIDE 0.1 % EX CREA: 1.0000 | TOPICAL_CREAM | Freq: Two times a day (BID) | CUTANEOUS | 0 refills | Status: AC

## 2023-10-27 NOTE — ED Provider Notes (Signed)
 [facility]    CSN: [id number] Arrival date & time: 10/27/23  1529      History   Chief Complaint Chief Complaint  Patient presents with   Exposure to STD    HPI Jacqueline [name] is a 23 y.o. female.   HPI  She is in today for reevaluation of STD exposure.  She reports that she was diagnosed with trichomonas and was treated.  She is here now for evaluation.  She is also complaining of eczema to her upper back and back on.  She reports that she feels might be related to her eating.  She has changed her eating habits.  She reports that it does itch however slightly.  She denies any other symptoms. 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   Severe persistent asthma without complication 01/28/2020   Migraine without aura and without status migrainosus, not intractable 10/26/2018   Anxiety and depression    Asthma, not well controlled, severe persistent, with acute exacerbation 04/27/2018   Self-injurious behavior 04/04/2018   MDD (major depressive disorder), recurrent, severe, 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   Acute respiratory failure, unsp w hypoxia or hypercapnia (HCC) 01/09/2017    Past Surgical History:  Procedure Laterality Date   GANGLION CYST EXCISION Right 04/26/2021   Procedure: right dorsal carpal ganglion cyst excision;  Surgeon: Gomez Cleverly, MD;  Location: [facility] OR;  Service: Orthopedics;  Laterality: Right;  with MAC anesthesia needs 30 minutes   INCISION AND DRAINAGE WOUND WITH NAILBED REPAIR Left 04/26/2021   Procedure: INCISION AND DRAINAGE WOUND WITH NAILBED REPAIR LEFT MIDDLE  FINGER;  Surgeon: Gomez Cleverly, MD;  Location: [facility] OR;  Service: Orthopedics;  Laterality: Left;   UMBILICAL HERNIA REPAIR      OB History   No obstetric history on file.      Home Medications    Prior to Admission medications   Medication Sig Start Date End Date Taking? Authorizing Provider  albuterol (VENTOLIN HFA) 108 (90 Base) MCG/ACT inhaler Inhale 2 puffs into the lungs every 4 (four) hours as needed for wheezing or shortness of breath. 10/12/23  Yes Kingsley, Turkey K, DO  BREZTRI AEROSPHERE 160-9-4.8 MCG/ACT AERO Inhale 2 puffs into the lungs in the morning and at bedtime. 10/12/23  Yes Theresia Lo, Benetta Spar K, DO  triamcinolone cream (KENALOG) 0.1 % Apply 1 Application topically 2 (two) times daily. 10/27/23  Yes Barbette Merino, NP  acetaminophen (TYLENOL) 500 MG tablet Take 1 tablet (500 mg total) by mouth every 6 (six) hours as needed. 03/05/23   Fayrene Helper, PA-C  albuterol (PROVENTIL) (2.5 MG/3ML) 0.083% nebulizer solution Take 3 mLs (2.5 mg total) by nebulization every 6 (six) hours as needed for wheezing or shortness of breath. 08/12/23   Valentino Nose, NP  EPIPEN 2-PAK 0.3 MG/0.3ML SOAJ injection Use as directed for life threatening allergic reactions 02/06/23   Marcelyn Bruins, MD    Family History Family History  Problem Relation Age of Onset   Allergic rhinitis Mother    Asthma Mother    COPD Mother  Migraines Mother    Allergic rhinitis Father    Asthma Father    Schizophrenia Father    Asthma Sister    Asthma Brother    Migraines Brother     Social History Social History   Tobacco Use   Smoking status: Some Days    Current packs/day: 0.00    Average packs/day: 0.3 packs/day for 4.0 years (1.0 ttl pk-yrs)    Types: Cigarettes, Cigars    Start date: 11/06/2015    Last attempt to quit: 11/06/2019    Years since quitting: 3.9   Smokeless tobacco: Never   Tobacco comments:    Patient smokes black n milds  Vaping Use   Vaping status: Never  Used  Substance Use Topics   Alcohol use: Not Currently   Drug use: Not Currently    Types: Marijuana    Comment: rarely     Allergies   Bee venom, Egg-derived products, Other, Peanut-containing drug products, and Pollen extract   Review of Systems Review of Systems   Physical Exam Triage Vital Signs ED Triage Vitals [10/27/23 1654]  Encounter Vitals Group     BP (!) 138/93     Systolic BP Percentile      Diastolic BP Percentile      Pulse Rate 93     Resp 18     Temp 98.8 F (37.1 C)     Temp Source Oral     SpO2 92 %     Weight      Height      Head Circumference      Peak Flow      Pain Score 0     Pain Loc      Pain Education      Exclude from Growth Chart    No data found.  Updated Vital Signs BP (!) 138/93 (BP Location: Right Arm)   Pulse 93   Temp 98.8 F (37.1 C) (Oral)   Resp 18   LMP 09/30/2023 (Approximate)   SpO2 92%   Visual Acuity Right Eye Distance:   Left Eye Distance:   Bilateral Distance:    Right Eye Near:   Left Eye Near:    Bilateral Near:     Physical Exam Constitutional:      Appearance: She is obese.  HENT:     Head: Normocephalic.  Cardiovascular:     Rate and Rhythm: Normal rate.  Pulmonary:     Effort: Pulmonary effort is normal.  Musculoskeletal:     Cervical back: Normal range of motion.  Skin:    Capillary Refill: Capillary refill takes less than 2 seconds.     Findings: Rash present.  Neurological:     General: No focal deficit present.     Mental Status: She is alert and oriented to person, place, and time.  Psychiatric:        Behavior: Behavior normal.      UC Treatments / Results  Labs (all labs ordered are listed, but only abnormal results are displayed) Labs Reviewed  CERVICOVAGINAL ANCILLARY ONLY    EKG   Radiology No results found.  Procedures Procedures (including critical care time)  Medications Ordered in UC Medications - No data to display  Initial Impression / Assessment  and Plan / UC Course  I have reviewed the triage vital signs and the nursing notes.  Pertinent labs & imaging results that were available during my care of the patient were reviewed by me and considered in my  medical decision making (see chart for details).     Std testing Final Clinical Impressions(s) / UC Diagnoses   Final diagnoses:  Encounter for assessment of STD exposure  Eczema, unspecified type     Discharge Instructions       You have been evaluated for sexually transmitted infections. The results will result in your MyChart. However a nurse will follow up with you for any positive results for further treatment recommendations. You have also been diagnosed with eczema you have been prescribed triamcinolone 0.1% 1 application twice daily to affected areas. If your symptoms persist or get worse please follow up here for further evaluation       ED Prescriptions     Medication Sig Dispense Auth. Provider   triamcinolone cream (KENALOG) 0.1 % Apply 1 Application topically 2 (two) times daily. 30 g Barbette Merino, NP      PDMP not reviewed this encounter.   Thad Ranger [place], [place] 10/27/23 [phone]

## 2023-10-27 NOTE — Discharge Instructions (Addendum)
 You have been evaluated for sexually transmitted infections. The results will result in your MyChart. However a nurse will follow up with you for any positive results for further treatment recommendations. You have also been diagnosed with eczema you have been prescribed triamcinolone 0.1% 1 application twice daily to affected areas. If your symptoms persist or get worse please follow up here for further evaluation

## 2023-10-27 NOTE — ED Triage Notes (Signed)
 Pt would like to be tested for STD, states she was positive for trich and wants to re-test to make sure its negative.   No symptoms at this time.

## 2023-10-28 ENCOUNTER — Ambulatory Visit
Admission: EM | Admit: 2023-10-28 | Discharge: 2023-10-28 | Disposition: A | Discharge status: Home / Self Care | Attending: Nurse Practitioner | Admitting: Nurse Practitioner

## 2023-10-28 ENCOUNTER — Encounter: Payer: Self-pay | Admitting: Emergency Medicine

## 2023-10-28 DIAGNOSIS — Z8619 Personal history of other infectious and parasitic diseases: Secondary | ICD-10-CM | POA: Diagnosis not present

## 2023-10-28 DIAGNOSIS — B3731 Acute candidiasis of vulva and vagina: Secondary | ICD-10-CM | POA: Diagnosis not present

## 2023-10-28 LAB — CERVICOVAGINAL ANCILLARY ONLY
Bacterial Vaginitis (gardnerella): NEGATIVE
Candida Glabrata: NEGATIVE
Candida Vaginitis: POSITIVE — AB
Chlamydia: NEGATIVE
Comment: NEGATIVE
Comment: NEGATIVE
Comment: NEGATIVE
Comment: NEGATIVE
Comment: NEGATIVE
Comment: NORMAL
Neisseria Gonorrhea: NEGATIVE
Trichomonas: POSITIVE — AB

## 2023-10-28 MED ORDER — FLUCONAZOLE 150 MG PO TABS: 150.0000 mg | ORAL_TABLET | Freq: Once | ORAL | 0 refills | Status: AC

## 2023-10-28 NOTE — ED Triage Notes (Signed)
 States she continues to test positive for trich.  Wants to know if she is positive or not. States her lab test show she has a vaginal infection.

## 2023-10-29 NOTE — ED Provider Notes (Signed)
 [facility]    CSN: [id number] Arrival date & time: 10/28/23  1734      History   Chief Complaint No chief complaint on file.   HPI Jacqueline [name] is a 23 y.o. female.   Patient presents today to discuss vaginal cytology swab results from yesterday.  Reports she tested positive for trichomoniasis and yeast infection.  She states she is completely asymptomatic, denies vaginal discharge, vaginal itching, vaginal rashes, sores, or lesions.  No abdominal pain, nausea/vomiting, or groin swelling.  No fevers or pelvic pain.  Patient is confident she is not pregnant; last menstrual cycle 09/30/2023.  Patient reports she was initially symptomatic with foul-smelling vaginal discharge, irritation and tested positive for trichomoniasis in January, 2025.  She took the entire treatment and was retested in March 2025, tested positive again, and was retreated.  She is wondering why she is still testing positive if she has completed the treatment 1 month later.  She denies recent sexual activity without condom use.    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   Severe persistent asthma without complication 01/28/2020   Migraine without aura and without status migrainosus, not intractable 10/26/2018   Anxiety and depression    Asthma, not well controlled, severe persistent, with acute exacerbation 04/27/2018   Self-injurious behavior 04/04/2018   MDD (major depressive disorder), recurrent, severe, 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   Acute respiratory failure, unsp w hypoxia or hypercapnia (HCC) 01/09/2017     Past Surgical History:  Procedure Laterality Date   GANGLION CYST EXCISION Right 04/26/2021   Procedure: right dorsal carpal ganglion cyst excision;  Surgeon: Gomez Cleverly, MD;  Location: [facility] OR;  Service: Orthopedics;  Laterality: Right;  with MAC anesthesia needs 30 minutes   INCISION AND DRAINAGE WOUND WITH NAILBED REPAIR Left 04/26/2021   Procedure: INCISION AND DRAINAGE WOUND WITH NAILBED REPAIR LEFT MIDDLE FINGER;  Surgeon: Gomez Cleverly, MD;  Location: [facility] OR;  Service: Orthopedics;  Laterality: Left;   UMBILICAL HERNIA REPAIR      OB History   No obstetric history on file.      Home Medications    Prior to Admission medications   Medication Sig Start Date End Date Taking? Authorizing Provider  acetaminophen (TYLENOL) 500 MG tablet Take 1 tablet (500 mg total) by mouth every 6 (six) hours as needed. 03/05/23   Fayrene Helper, PA-C  albuterol (PROVENTIL) (2.5 MG/3ML) 0.083% nebulizer solution Take 3 mLs (2.5 mg total) by nebulization every 6 (six) hours as needed for wheezing or shortness of breath. 08/12/23   Valentino Nose, NP  albuterol (VENTOLIN HFA) 108 (90 Base) MCG/ACT inhaler Inhale 2 puffs into the lungs every 4 (four) hours as needed for wheezing or shortness of breath. 10/12/23   Theresia Lo, Turkey K, DO  BREZTRI AEROSPHERE 160-9-4.8 MCG/ACT AERO Inhale 2 puffs into the lungs in the morning and at bedtime. 10/12/23   Rexford Maus, DO  EPIPEN 2-PAK 0.3 MG/0.3ML SOAJ injection Use as directed for life threatening allergic reactions 02/06/23   Marcelyn Bruins, MD  triamcinolone cream (KENALOG) 0.1 % Apply 1 Application topically 2 (two) times daily. 10/27/23   Barbette Merino, NP    Family History Family History  Problem Relation Age of Onset   Allergic rhinitis Mother    Asthma Mother    COPD Mother    Migraines Mother    Allergic rhinitis Father    Asthma Father    Schizophrenia Father    Asthma Sister    Asthma Brother    Migraines Brother      Social History Social History   Tobacco Use   Smoking status: Some Days    Current packs/day: 0.00    Average packs/day: 0.3 packs/day for 4.0 years (1.0 ttl pk-yrs)    Types: Cigarettes, Cigars    Start date: 11/06/2015    Last attempt to quit: 11/06/2019    Years since quitting: 3.9   Smokeless tobacco: Never   Tobacco comments:    Patient smokes black n milds  Vaping Use   Vaping status: Never Used  Substance Use Topics   Alcohol use: Not Currently   Drug use: Not Currently    Types: Marijuana    Comment: rarely     Allergies   Bee venom, Egg-derived products, Other, Peanut-containing drug products, and Pollen extract   Review of Systems Review of Systems Per HPI  Physical Exam Triage Vital Signs ED Triage Vitals  Encounter Vitals Group     BP 10/28/23 1826 (!) 127/92     Systolic BP Percentile --      Diastolic BP Percentile --      Pulse Rate 10/28/23 1826 81     Resp 10/28/23 1826 18     Temp 10/28/23 1826 98.4 F (36.9 C)     Temp Source 10/28/23 1826 Oral     SpO2 10/28/23 1826 96 %     Weight --      Height --      Head Circumference --      Peak Flow --      Pain Score 10/28/23 1828 0     Pain Loc --      Pain Education --      Exclude from Growth Chart --    No data found.  Updated Vital Signs BP (!) 127/92 (BP Location: Right Arm)   Pulse 81   Temp 98.4 F (36.9 C) (Oral)   Resp 18   LMP 09/30/2023 (Approximate)   SpO2 96%   Visual Acuity Right Eye Distance:   Left Eye Distance:   Bilateral Distance:    Right Eye Near:   Left Eye Near:    Bilateral Near:     Physical Exam Vitals and nursing note reviewed.  Constitutional:      General: She is not in acute distress.    Appearance: Normal appearance. She is not toxic-appearing.  Pulmonary:     Effort: Pulmonary effort is normal. No respiratory distress.  Skin:    General: Skin is warm and dry.     Coloration: Skin is not jaundiced or pale.     Findings: No erythema.   Neurological:     Mental Status: She is alert and oriented to person, place, and time.     Motor: No weakness.     Gait: Gait normal.  Psychiatric:        Mood and Affect: Mood normal.        Behavior: Behavior is cooperative.      UC Treatments / Results  Labs (  all labs ordered are listed, but only abnormal results are displayed) Labs Reviewed - No data to display  EKG   Radiology No results found.  Procedures Procedures (including critical care time)  Medications Ordered in UC Medications - No data to display  Initial Impression / Assessment and Plan / UC Course  I have reviewed the triage vital signs and the nursing notes.  Pertinent labs & imaging results that were available during my care of the patient were reviewed by me and considered in my medical decision making (see chart for details).   Patient is mildly hypertensive in triage today, otherwise vital signs are stable.  1. Yeast vaginitis Treat with fluconazole 150 g one-time.  2. History of trichomoniasis We discussed per CDC recommendations, do not recommend retesting within a 58-month time frame due to likely false positive.  Since patient is asymptomatic at this time, using shared patient decision making, we decided to defer treatment for trichomoniasis.  Discussed with patient that she can return for retesting of trichomoniasis if she desires in June/July to test for cure, otherwise to return sooner if she develops any symptoms.  We also discussed safe sex practices in the meantime. Patient declines HIV and syphilis testing today as she had that done back in January 2025 and was negative.  The patient was given the opportunity to ask questions.  All questions answered to their satisfaction.  The patient is in agreement to this plan.   Final Clinical Impressions(s) / UC Diagnoses   Final diagnoses:  Yeast vaginitis   Discharge Instructions   None    ED Prescriptions     Medication Sig Dispense  Auth. Provider   fluconazole (DIFLUCAN) 150 MG tablet Take 1 tablet (150 mg total) by mouth once for 1 dose. 1 tablet Valentino Nose, NP      PDMP not reviewed this encounter.   Valentino Nose, NP 10/29/23 [phone]

## 2023-11-19 ENCOUNTER — Other Ambulatory Visit: Payer: Self-pay | Admitting: Allergy

## 2023-11-19 ENCOUNTER — Other Ambulatory Visit: Payer: Self-pay | Admitting: Nurse Practitioner

## 2023-12-08 ENCOUNTER — Ambulatory Visit
Admission: EM | Admit: 2023-12-08 | Discharge: 2023-12-08 | Disposition: A | Discharge status: Home / Self Care | Attending: Nurse Practitioner | Admitting: Nurse Practitioner

## 2023-12-08 DIAGNOSIS — Z7251 High risk heterosexual behavior: Secondary | ICD-10-CM | POA: Diagnosis present

## 2023-12-08 DIAGNOSIS — Z113 Encounter for screening for infections with a predominantly sexual mode of transmission: Secondary | ICD-10-CM | POA: Insufficient documentation

## 2023-12-08 DIAGNOSIS — J4551 Severe persistent asthma with (acute) exacerbation: Secondary | ICD-10-CM | POA: Insufficient documentation

## 2023-12-08 MED ORDER — ALBUTEROL SULFATE HFA 108 (90 BASE) MCG/ACT IN AERS: 2.0000 | INHALATION_SPRAY | RESPIRATORY_TRACT | 0 refills | Status: DC | PRN

## 2023-12-08 NOTE — ED Triage Notes (Signed)
 Pt reports she wants follow up testing to see if she still has trich x 5 months ago.   Also recently has had unprotected  intercourse.

## 2023-12-08 NOTE — ED Provider Notes (Signed)
 [facility]    CSN: [id number] Arrival date & time: 12/08/23  1245      History   Chief Complaint No chief complaint on file.   HPI Jacqueline [name] is a 23 y.o. female.   Patient presents today for STD testing.  Reports recent unprotected sexual intercourse orally and vaginally.  She reports history of trichomoniasis, first tested positive 4 months ago.  She is currently asymptomatic-denies vaginal discharge, odor, rashes, sores, or lesions on her genitalia, groin swelling, pelvic pain, abdominal pain, fever, nausea/vomiting.  No known exposures to STI.  Patient is also requesting refill of albuterol  inhaler.  Reports she typically uses rescue inhaler a couple of times per week but here recently has been smoking more marijuana due to the death of her father and has been using her rescue inhaler to help with her breathing more.  She knows that marijuana is a trigger for her breathing.  She is still taking Breztri  as prescribed.  She is due for follow-up with allergist and plans to call and make an appointment.    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   Severe persistent asthma without complication 01/28/2020   Migraine without aura and without status migrainosus, not intractable 10/26/2018   Anxiety and depression    Asthma, not well controlled, severe persistent, with acute exacerbation 04/27/2018   Self-injurious behavior 04/04/2018   MDD (major depressive disorder), recurrent, severe, 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   Acute respiratory failure, unsp w hypoxia or hypercapnia (HCC)  01/09/2017    Past Surgical History:  Procedure Laterality Date   GANGLION CYST EXCISION Right 04/26/2021   Procedure: right dorsal carpal ganglion cyst excision;  Surgeon: Ltanya Rummer, MD;  Location: [facility] OR;  Service: Orthopedics;  Laterality: Right;  with MAC anesthesia needs 30 minutes   INCISION AND DRAINAGE WOUND WITH NAILBED REPAIR Left 04/26/2021   Procedure: INCISION AND DRAINAGE WOUND WITH NAILBED REPAIR LEFT MIDDLE FINGER;  Surgeon: Ltanya Rummer, MD;  Location: [facility] OR;  Service: Orthopedics;  Laterality: Left;   UMBILICAL HERNIA REPAIR      OB History   No obstetric history on file.      Home Medications    Prior to Admission medications   Medication Sig Start Date End Date Taking? Authorizing Provider  acetaminophen  (TYLENOL ) 500 MG tablet Take 1 tablet (500 mg total) by mouth every 6 (six) hours as needed. 03/05/23   Debbra Fairy, PA-C  albuterol  (PROVENTIL ) (2.5 MG/3ML) 0.083% nebulizer solution Take 3 mLs (2.5 mg total) by nebulization every 6 (six) hours as needed for wheezing or shortness of breath. 08/12/23   Wilhemena Harbour, NP  albuterol  (VENTOLIN  HFA) 108 (90 Base) MCG/ACT inhaler Inhale 2 puffs into the lungs every 4 (four) hours as needed for wheezing or shortness of breath. 12/08/23   Wilhemena Harbour, NP  BREZTRI  AEROSPHERE 160-9-4.8 MCG/ACT AERO Inhale 2 puffs into the lungs in the morning and at bedtime. 10/12/23   Kingsley, Victoria K, DO  EPIPEN  2-PAK 0.3 MG/0.3ML SOAJ injection Use as directed for life  threatening allergic reactions 02/06/23   Brian Campanile, MD  triamcinolone  cream (KENALOG ) 0.1 % Apply 1 Application topically 2 (two) times daily. 10/27/23   Gregoria Leas, NP    Family History Family History  Problem Relation Age of Onset   Allergic rhinitis Mother    Asthma Mother    COPD Mother    Migraines Mother    Allergic rhinitis Father    Asthma Father    Schizophrenia Father    Asthma Sister    Asthma Brother    Migraines  Brother     Social History Social History   Tobacco Use   Smoking status: Some Days    Current packs/day: 0.00    Average packs/day: 0.3 packs/day for 4.0 years (1.0 ttl pk-yrs)    Types: Cigarettes, Cigars    Start date: 11/06/2015    Last attempt to quit: 11/06/2019    Years since quitting: 4.0   Smokeless tobacco: Never   Tobacco comments:    Patient smokes black n milds  Vaping Use   Vaping status: Never Used  Substance Use Topics   Alcohol use: Not Currently   Drug use: Not Currently    Types: Marijuana    Comment: rarely     Allergies   Bee venom, Egg-derived products, Other, Peanut-containing drug products, and Pollen extract   Review of Systems Review of Systems Per HPI  Physical Exam Triage Vital Signs ED Triage Vitals  Encounter Vitals Group     BP 12/08/23 1257 (!) 132/90     Systolic BP Percentile --      Diastolic BP Percentile --      Pulse Rate 12/08/23 1257 76     Resp 12/08/23 1257 20     Temp 12/08/23 1257 98.9 F (37.2 C)     Temp Source 12/08/23 1257 Oral     SpO2 12/08/23 1257 94 %     Weight --      Height --      Head Circumference --      Peak Flow --      Pain Score 12/08/23 1300 0     Pain Loc --      Pain Education --      Exclude from Growth Chart --    No data found.  Updated Vital Signs BP (!) 132/90 (BP Location: Right Arm)   Pulse 76   Temp 98.9 F (37.2 C) (Oral)   Resp 20   LMP 11/19/2023   SpO2 94%   Visual Acuity Right Eye Distance:   Left Eye Distance:   Bilateral Distance:    Right Eye Near:   Left Eye Near:    Bilateral Near:     Physical Exam Vitals and nursing note reviewed.  Constitutional:      General: She is not in acute distress.    Appearance: Normal appearance. She is not toxic-appearing.  HENT:     Mouth/Throat:     Mouth: Mucous membranes are moist.     Pharynx: Oropharynx is clear.  Pulmonary:     Effort: Pulmonary effort is normal. No tachypnea or respiratory distress.     Breath  sounds: Wheezing (audible) present.  Skin:    General: Skin is warm and dry.     Coloration: Skin is not jaundiced or pale.     Findings: No erythema.  Neurological:     Mental Status: She is alert and oriented to person, place, and time.     Motor:  No weakness.     Gait: Gait normal.  Psychiatric:        Behavior: Behavior is cooperative.      UC Treatments / Results  Labs (all labs ordered are listed, but only abnormal results are displayed) Labs Reviewed  HIV ANTIBODY (ROUTINE TESTING W REFLEX)  RPR  CERVICOVAGINAL ANCILLARY ONLY    EKG   Radiology No results found.  Procedures Procedures (including critical care time)  Medications Ordered in UC Medications - No data to display  Initial Impression / Assessment and Plan / UC Course  I have reviewed the triage vital signs and the nursing notes.  Pertinent labs & imaging results that were available during my care of the patient were reviewed by me and considered in my medical decision making (see chart for details).   Patient is well-appearing, normotensive, afebrile, not tachycardic, not tachypneic, oxygenating well on room air.    1. Severe persistent asthma with acute exacerbation Vitals are stable in triage and patient is in no acute distress Refill given for albuterol  inhaler Recommended close follow-up with allergist  2. Screening examination for STD (sexually transmitted disease) 3. Unprotected sexual intercourse Cytology self swab is pending-treat as indicated if anything positive HIV and syphilis testing also pending per patient request Condoms given today and recommended safe sex practices  The patient was given the opportunity to ask questions.  All questions answered to their satisfaction.  The patient is in agreement to this plan.   Final Clinical Impressions(s) / UC Diagnoses   Final diagnoses:  Severe persistent asthma with acute exacerbation  Screening examination for STD (sexually  transmitted disease)  Unprotected sexual intercourse     Discharge Instructions      We will contact you if any of the testing from today comes back positive.  In the meantime, recommend condom use with every sexual encounter.  I have refilled your albuterol  inhaler today.  Please follow-up with your allergist.  ED Prescriptions     Medication Sig Dispense Auth. Provider   albuterol  (VENTOLIN  HFA) 108 (90 Base) MCG/ACT inhaler Inhale 2 puffs into the lungs every 4 (four) hours as needed for wheezing or shortness of breath. 18 g Wilhemena Harbour, NP      PDMP not reviewed this encounter.   Wilhemena Harbour, NP 12/08/23 [phone]

## 2023-12-08 NOTE — Discharge Instructions (Signed)
 We will contact you if any of the testing from today comes back positive.  In the meantime, recommend condom use with every sexual encounter.  I have refilled your albuterol  inhaler today.  Please follow-up with your allergist.

## 2023-12-09 ENCOUNTER — Ambulatory Visit (HOSPITAL_COMMUNITY): Payer: Self-pay

## 2023-12-09 LAB — CERVICOVAGINAL ANCILLARY ONLY
Bacterial Vaginitis (gardnerella): NEGATIVE
Candida Glabrata: NEGATIVE
Candida Vaginitis: NEGATIVE
Chlamydia: NEGATIVE
Comment: NEGATIVE
Comment: NEGATIVE
Comment: NEGATIVE
Comment: NEGATIVE
Comment: NEGATIVE
Comment: NORMAL
Neisseria Gonorrhea: NEGATIVE
Trichomonas: POSITIVE — AB

## 2023-12-09 LAB — HIV ANTIBODY (ROUTINE TESTING W REFLEX): HIV Screen 4th Generation wRfx: NONREACTIVE

## 2023-12-09 LAB — RPR: RPR Ser Ql: NONREACTIVE

## 2023-12-09 MED ORDER — METRONIDAZOLE 500 MG PO TABS: 500.0000 mg | ORAL_TABLET | Freq: Two times a day (BID) | ORAL | 0 refills | Status: AC

## 2023-12-16 ENCOUNTER — Ambulatory Visit
Admission: EM | Admit: 2023-12-16 | Discharge: 2023-12-16 | Disposition: A | Discharge status: Home / Self Care | Attending: Family Medicine | Admitting: Family Medicine

## 2023-12-16 ENCOUNTER — Other Ambulatory Visit: Payer: Self-pay

## 2023-12-16 ENCOUNTER — Encounter: Payer: Self-pay | Admitting: Emergency Medicine

## 2023-12-16 DIAGNOSIS — J4541 Moderate persistent asthma with (acute) exacerbation: Secondary | ICD-10-CM | POA: Diagnosis not present

## 2023-12-16 DIAGNOSIS — J069 Acute upper respiratory infection, unspecified: Secondary | ICD-10-CM

## 2023-12-16 DIAGNOSIS — J3089 Other allergic rhinitis: Secondary | ICD-10-CM | POA: Diagnosis not present

## 2023-12-16 LAB — POC SARS CORONAVIRUS 2 AG -  ED: SARS Coronavirus 2 Ag: NEGATIVE

## 2023-12-16 MED ORDER — AZELASTINE HCL 0.1 % NA SOLN: 1.0000 | Freq: Two times a day (BID) | NASAL | 0 refills | Status: AC

## 2023-12-16 MED ORDER — PROMETHAZINE-DM 6.25-15 MG/5ML PO SYRP: 5.0000 mL | ORAL_SOLUTION | Freq: Four times a day (QID) | ORAL | 0 refills | Status: AC | PRN

## 2023-12-16 MED ORDER — CETIRIZINE HCL 10 MG PO TABS: 10.0000 mg | ORAL_TABLET | Freq: Every day | ORAL | 2 refills | Status: AC

## 2023-12-16 MED ORDER — DEXAMETHASONE SODIUM PHOSPHATE 10 MG/ML IJ SOLN
10.0000 mg | Freq: Once | INTRAMUSCULAR | Status: AC
  Administered 2023-12-16: 10 mg via INTRAMUSCULAR

## 2023-12-16 NOTE — Discharge Instructions (Signed)
 Your COVID test was negative today.  I suspect either a viral illness or seasonal allergy flareup causing an asthma exacerbation.  I have started you on an allergy regimen of a Zyrtec  and nasal spray daily, continue your inhaler regimen including albuterol  every 4 hours as needed.  We have also given you a steroid shot today to help with your symptoms.

## 2023-12-16 NOTE — ED Triage Notes (Signed)
 Pt reports cough, sore throat, runny nose since yesterday.

## 2023-12-18 ENCOUNTER — Ambulatory Visit

## 2023-12-18 ENCOUNTER — Ambulatory Visit
Admission: EM | Admit: 2023-12-18 | Discharge: 2023-12-18 | Disposition: A | Discharge status: Home / Self Care | Attending: Nurse Practitioner | Admitting: Nurse Practitioner

## 2023-12-18 ENCOUNTER — Encounter: Payer: Self-pay | Admitting: Emergency Medicine

## 2023-12-18 DIAGNOSIS — A599 Trichomoniasis, unspecified: Secondary | ICD-10-CM | POA: Insufficient documentation

## 2023-12-18 DIAGNOSIS — R399 Unspecified symptoms and signs involving the genitourinary system: Secondary | ICD-10-CM | POA: Diagnosis present

## 2023-12-18 LAB — POCT URINALYSIS DIP (MANUAL ENTRY)
Bilirubin, UA: NEGATIVE
Glucose, UA: NEGATIVE mg/dL
Ketones, POC UA: NEGATIVE mg/dL
Nitrite, UA: NEGATIVE
Protein Ur, POC: 100 mg/dL — AB
Spec Grav, UA: >=1.03 — AB
Urobilinogen, UA: 0.2 U/dL
pH, UA: 6

## 2023-12-18 MED ORDER — NITROFURANTOIN MONOHYD MACRO 100 MG PO CAPS: 100.0000 mg | ORAL_CAPSULE | Freq: Two times a day (BID) | ORAL | 0 refills | Status: AC

## 2023-12-18 MED ORDER — TINIDAZOLE 500 MG PO TABS: 2.0000 g | ORAL_TABLET | Freq: Once | ORAL | 0 refills | Status: AC

## 2023-12-18 NOTE — ED Triage Notes (Signed)
 Urinary frequency and urgency  that started yesterday.

## 2023-12-18 NOTE — ED Provider Notes (Signed)
 [facility]    CSN: [id number] Arrival date & time: 12/18/23  1832      History   Chief Complaint No chief complaint on file.   HPI Jacqueline [name] is a 23 y.o. female.   The history is provided by the patient.   Patient presents with a 1 day history of urinary frequency, urgency, and suprapubic pain.  Patient denies fever, chills, abdominal pain, nausea, vomiting, diarrhea, hematuria, dysuria, flank pain, low back pain, vaginal discharge, vaginal odor, or vaginal itching.  Patient does state that she was recently tested for trichomonas and tested positive.  States that she is concerned that the medication prescribed for trichomonas is not working for her.  States that she has not started the medication for this reason.  She denies any new sexual partners.  Patient is requesting another medication for trichomonas treatment.  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   Severe persistent asthma without complication 01/28/2020   Migraine without aura and without status migrainosus, not intractable 10/26/2018   Anxiety and depression    Asthma, not well controlled, severe persistent, with acute exacerbation 04/27/2018   Self-injurious behavior 04/04/2018   MDD (major depressive disorder), recurrent, severe, 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   Acute respiratory failure, unsp w hypoxia or hypercapnia (HCC) 01/09/2017    Past Surgical History:  Procedure Laterality Date   GANGLION CYST EXCISION Right 04/26/2021   Procedure: right dorsal carpal ganglion cyst excision;  Surgeon: Ltanya Rummer, MD;   Location: [facility] OR;  Service: Orthopedics;  Laterality: Right;  with MAC anesthesia needs 30 minutes   INCISION AND DRAINAGE WOUND WITH NAILBED REPAIR Left 04/26/2021   Procedure: INCISION AND DRAINAGE WOUND WITH NAILBED REPAIR LEFT MIDDLE FINGER;  Surgeon: Ltanya Rummer, MD;  Location: [facility] OR;  Service: Orthopedics;  Laterality: Left;   UMBILICAL HERNIA REPAIR      OB History   No obstetric history on file.      Home Medications    Prior to Admission medications   Medication Sig Start Date End Date Taking? Authorizing Provider  nitrofurantoin , macrocrystal-monohydrate, (MACROBID ) 100 MG capsule Take 1 capsule (100 mg total) by mouth 2 (two) times daily. 12/18/23  Yes Leath-Warren, Belen Bowers, NP  tinidazole  (TINDAMAX ) 500 MG tablet Take 4 tablets (2,000 mg total) by mouth once for 1 dose. 12/18/23 12/18/23 Yes Leath-Warren, Belen Bowers, NP  acetaminophen  (TYLENOL ) 500 MG tablet Take 1 tablet (500 mg total) by mouth every 6 (six) hours as needed. 03/05/23   Debbra Fairy, PA-C  albuterol  (PROVENTIL ) (2.5 MG/3ML) 0.083% nebulizer solution Take 3 mLs (2.5 mg total) by nebulization every 6 (six) hours as needed for wheezing or shortness of breath. 08/12/23   Wilhemena Harbour, NP  albuterol  (VENTOLIN  HFA) 108 (90 Base) MCG/ACT inhaler Inhale 2 puffs into the lungs every 4 (four) hours as needed for wheezing or shortness of breath. 12/08/23   Wilhemena Harbour, NP  azelastine  (ASTELIN ) 0.1 % nasal spray Place 1 spray into both nostrils 2 (two) times daily. Use in each nostril as directed  12/16/23   Corbin Dess, PA-C  BREZTRI  AEROSPHERE 160-9-4.8 MCG/ACT AERO Inhale 2 puffs into the lungs in the morning and at bedtime. 10/12/23   Kingsley, Victoria K, DO  cetirizine  (ZYRTEC  ALLERGY) 10 MG tablet Take 1 tablet (10 mg total) by mouth daily. 12/16/23   Corbin Dess, PA-C  EPIPEN  2-PAK 0.3 MG/0.3ML SOAJ injection Use as directed for life threatening allergic reactions Patient not taking:  Reported on 12/16/2023 02/06/23   Brian Campanile, MD  promethazine-dextromethorphan (PROMETHAZINE-DM) 6.25-15 MG/5ML syrup Take 5 mLs by mouth 4 (four) times daily as needed. 12/16/23   Corbin Dess, PA-C  triamcinolone  cream (KENALOG ) 0.1 % Apply 1 Application topically 2 (two) times daily. 10/27/23   Gregoria Leas, NP    Family History Family History  Problem Relation Age of Onset   Allergic rhinitis Mother    Asthma Mother    COPD Mother    Migraines Mother    Allergic rhinitis Father    Asthma Father    Schizophrenia Father    Asthma Sister    Asthma Brother    Migraines Brother     Social History Social History   Tobacco Use   Smoking status: Some Days    Current packs/day: 0.00    Average packs/day: 0.3 packs/day for 4.0 years (1.0 ttl pk-yrs)    Types: Cigarettes, Cigars    Start date: 11/06/2015    Last attempt to quit: 11/06/2019    Years since quitting: 4.1   Smokeless tobacco: Never   Tobacco comments:    Patient smokes black n milds  Vaping Use   Vaping status: Never Used  Substance Use Topics   Alcohol use: Not Currently   Drug use: Not Currently    Types: Marijuana    Comment: rarely     Allergies   Bee venom, Egg-derived products, Other, Peanut-containing drug products, and Pollen extract   Review of Systems Review of Systems Per H PI  Physical Exam Triage Vital Signs ED Triage Vitals  Encounter Vitals Group     BP 12/18/23 1840 136/86     Systolic BP Percentile --      Diastolic BP Percentile --      Pulse Rate 12/18/23 1840 93     Resp 12/18/23 1840 18     Temp 12/18/23 1840 98.5 F (36.9 C)     Temp Source 12/18/23 1840 Oral     SpO2 12/18/23 1840 93 %     Weight --      Height --      Head Circumference --      Peak Flow --      Pain Score 12/18/23 1841 9     Pain Loc --      Pain Education --      Exclude from Growth Chart --    No data found.  Updated Vital Signs BP 136/86 (BP Location: Right Arm)    Pulse 93   Temp 98.5 F (36.9 C) (Oral)   Resp 18   LMP 12/09/2023 (Approximate)   SpO2 93%   Visual Acuity Right Eye Distance:   Left Eye Distance:   Bilateral Distance:    Right Eye Near:   Left Eye Near:    Bilateral Near:     Physical Exam Vitals and nursing note reviewed.  Constitutional:      Appearance: Normal appearance.  HENT:     Head: Normocephalic.  Eyes:     Extraocular Movements: Extraocular movements  intact.     Pupils: Pupils are equal, round, and reactive to light.  Cardiovascular:     Rate and Rhythm: Normal rate and regular rhythm.     Pulses: Normal pulses.     Heart sounds: Normal heart sounds.  Pulmonary:     Effort: Pulmonary effort is normal. No respiratory distress.     Breath sounds: Normal breath sounds. No stridor. No wheezing, rhonchi or rales.  Abdominal:     General: Bowel sounds are normal.     Palpations: Abdomen is soft.     Tenderness: There is abdominal tenderness in the suprapubic area. There is no right CVA tenderness or left CVA tenderness.  Skin:    General: Skin is warm and dry.  Neurological:     General: No focal deficit present.     Mental Status: She is alert and oriented to person, place, and time.  Psychiatric:        Mood and Affect: Mood normal.        Behavior: Behavior normal.      UC Treatments / Results  Labs (all labs ordered are listed, but only abnormal results are displayed) Labs Reviewed  POCT URINALYSIS DIP (MANUAL ENTRY) - Abnormal; Notable for the following components:      Result Value   Clarity, UA cloudy (*)    Spec Grav, UA >=1.030 (*)    Blood, UA large (*)    Protein Ur, POC =100 (*)    Leukocytes, UA Large (3+) (*)    All other components within normal limits  URINE CULTURE    EKG   Radiology No results found.  Procedures Procedures (including critical care time)  Medications Ordered in UC Medications - No data to display  Initial Impression / Assessment and Plan / UC Course   I have reviewed the triage vital signs and the nursing notes.  Pertinent labs & imaging results that were available during my care of the patient were reviewed by me and considered in my medical decision making (see chart for details).  Urinalysis is suggestive of a UTI.  Urine culture is pending.  Will treat with Macrobid 100 mg twice daily for the next 5 days.  For patient's treatment for trichomonas.  Tinidazole 2gm prescribed for treatment of trichomonas.  Supportive care recommendations were provided and discussed with the patient to include refraining from sexual intercourse for an additional 7 days after completing treatment for trichomonas and ensuring she has notified all partners.  For UTI symptoms, patient encouraged to increase her fluid intake, over-the-counter analgesics, developing a toileting schedule, and avoiding caffeine such as tea, soda, or coffee while symptoms persist.  Patient was given strict ER follow-up precautions.  Patient was in agreement with this plan of care and verbalizes understanding.  All questions were answered.  Patient stable for discharge.  Final Clinical Impressions(s) / UC Diagnoses   Final diagnoses:  UTI symptoms  Trichomonas infection     Discharge Instructions      The urinalysis suggest that you may have a urinary tract infection.  Urine culture has been ordered.  You will be contacted if the pending test result shows that the medication prescribed today needs to be changed. Take medication as prescribed.  Refrain from sexual intercourse for an additional 7 days after completing treatment for trichomonas. Please notify any partners she may have come into contact with regarding her positive STD D result. Try to drink at least 8-10 8 ounce glasses of water daily. You  may take over-the-counter Tylenol  or ibuprofen  as needed for pain, fever, or general discomfort. Develop a toileting schedule that will allow you to urinate at least every 2  hours. Avoid caffeine such as tea, soda, or coffee while symptoms persist. Follow-up in the emergency department immediately if you develop fever, chills, or worsening urinary symptoms. If symptoms do not improve with this treatment, you may follow-up in this clinic or with your primary care physician for further evaluation. Follow-up as needed.    ED Prescriptions     Medication Sig Dispense Auth. Provider   nitrofurantoin, macrocrystal-monohydrate, (MACROBID) 100 MG capsule Take 1 capsule (100 mg total) by mouth 2 (two) times daily. 10 capsule Leath-Warren, Belen Bowers, NP   tinidazole (TINDAMAX) 500 MG tablet Take 4 tablets (2,000 mg total) by mouth once for 1 dose. 4 tablet Leath-Warren, Belen Bowers, NP      PDMP not reviewed this encounter.   Hardy Lia, NP 12/18/23 1914

## 2023-12-18 NOTE — Discharge Instructions (Addendum)
 The urinalysis suggest that you may have a urinary tract infection.  Urine culture has been ordered.  You will be contacted if the pending test result shows that the medication prescribed today needs to be changed. Take medication as prescribed.  Refrain from sexual intercourse for an additional 7 days after completing treatment for trichomonas. Please notify any partners she may have come into contact with regarding her positive STD D result. Try to drink at least 8-10 8 ounce glasses of water daily. You may take over-the-counter Tylenol  or ibuprofen  as needed for pain, fever, or general discomfort. Develop a toileting schedule that will allow you to urinate at least every 2 hours. Avoid caffeine such as tea, soda, or coffee while symptoms persist. Follow-up in the emergency department immediately if you develop fever, chills, or worsening urinary symptoms. If symptoms do not improve with this treatment, you may follow-up in this clinic or with your primary care physician for further evaluation. Follow-up as needed.

## 2023-12-19 NOTE — ED Provider Notes (Signed)
 [facility]    CSN: [id number] Arrival date & time: 12/16/23  1424      History   Chief Complaint Chief Complaint  Patient presents with   Sore Throat    HPI Jacqueline [name] is a 23 y.o. female.   Presenting today with 1 day history of cough, sore throat, runny nose, wheezing, chest tightness, mild shortness of breath.  Denies fever, chest pain, abdominal pain, vomiting, diarrhea.  So far trying albuterol  with minimal relief, last dose was earlier this morning.  History of seasonal allergies not currently on regimen, asthma on albuterol  as needed.    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   Severe persistent asthma without complication 01/28/2020   Migraine without aura and without status migrainosus, not intractable 10/26/2018   Anxiety and depression    Asthma, not well controlled, severe persistent, with acute exacerbation 04/27/2018   Self-injurious behavior 04/04/2018   MDD (major depressive disorder), recurrent, severe, 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   Acute respiratory failure, unsp w hypoxia or hypercapnia (HCC) 01/09/2017    Past Surgical History:  Procedure Laterality Date   GANGLION CYST EXCISION Right 04/26/2021   Procedure: right dorsal carpal ganglion cyst excision;  Surgeon: Ltanya Rummer, MD;  Location: [facility] OR;  Service: Orthopedics;  Laterality: Right;  with MAC anesthesia needs 30 minutes   INCISION AND DRAINAGE WOUND WITH NAILBED REPAIR Left 04/26/2021   Procedure: INCISION AND DRAINAGE WOUND WITH NAILBED REPAIR LEFT MIDDLE FINGER;  Surgeon: Ltanya Rummer, MD;   Location: [facility] OR;  Service: Orthopedics;  Laterality: Left;   UMBILICAL HERNIA REPAIR      OB History   No obstetric history on file.      Home Medications    Prior to Admission medications   Medication Sig Start Date End Date Taking? Authorizing Provider  azelastine  (ASTELIN ) 0.1 % nasal spray Place 1 spray into both nostrils 2 (two) times daily. Use in each nostril as directed 12/16/23  Yes Corbin Dess, PA-C  cetirizine  (ZYRTEC  ALLERGY) 10 MG tablet Take 1 tablet (10 mg total) by mouth daily. 12/16/23  Yes Corbin Dess, PA-C  promethazine-dextromethorphan (PROMETHAZINE-DM) 6.25-15 MG/5ML syrup Take 5 mLs by mouth 4 (four) times daily as needed. 12/16/23  Yes Corbin Dess, PA-C  acetaminophen  (TYLENOL ) 500 MG tablet Take 1 tablet (500 mg total) by mouth every 6 (six) hours as needed. 03/05/23   Debbra Fairy, PA-C  albuterol  (PROVENTIL ) (2.5 MG/3ML) 0.083% nebulizer solution Take 3 mLs (2.5 mg total) by nebulization every 6 (six) hours as needed for wheezing or shortness of breath. 08/12/23   Wilhemena Harbour, NP  albuterol  (VENTOLIN  HFA) 108 (90 Base) MCG/ACT inhaler Inhale 2 puffs into the lungs every 4 (four) hours as needed for wheezing or shortness of breath. 12/08/23   Wilhemena Harbour, NP  BREZTRI  AEROSPHERE 160-9-4.8 MCG/ACT AERO Inhale 2 puffs into the lungs in the morning and at bedtime. 10/12/23   Kingsley, Victoria K, DO  EPIPEN  2-PAK 0.3 MG/0.3ML SOAJ injection Use as directed for life threatening allergic  reactions Patient not taking: Reported on 12/16/2023 02/06/23   Brian Campanile, MD  nitrofurantoin , macrocrystal-monohydrate, (MACROBID ) 100 MG capsule Take 1 capsule (100 mg total) by mouth 2 (two) times daily. 12/18/23   Leath-Warren, Belen Bowers, NP  triamcinolone  cream (KENALOG ) 0.1 % Apply 1 Application topically 2 (two) times daily. 10/27/23   Gregoria Leas, NP    Family History Family History  Problem Relation Age of Onset    Allergic rhinitis Mother    Asthma Mother    COPD Mother    Migraines Mother    Allergic rhinitis Father    Asthma Father    Schizophrenia Father    Asthma Sister    Asthma Brother    Migraines Brother     Social History Social History   Tobacco Use   Smoking status: Some Days    Current packs/day: 0.00    Average packs/day: 0.3 packs/day for 4.0 years (1.0 ttl pk-yrs)    Types: Cigarettes, Cigars    Start date: 11/06/2015    Last attempt to quit: 11/06/2019    Years since quitting: 4.1   Smokeless tobacco: Never   Tobacco comments:    Patient smokes black n milds  Vaping Use   Vaping status: Never Used  Substance Use Topics   Alcohol use: Not Currently   Drug use: Not Currently    Types: Marijuana    Comment: rarely     Allergies   Bee venom, Egg-derived products, Other, Peanut-containing drug products, and Pollen extract   Review of Systems Review of Systems Per HPI  Physical Exam Triage Vital Signs ED Triage Vitals  Encounter Vitals Group     BP 12/16/23 1526 120/77     Systolic BP Percentile --      Diastolic BP Percentile --      Pulse Rate 12/16/23 1526 73     Resp 12/16/23 1526 20     Temp 12/16/23 1526 98.5 F (36.9 C)     Temp Source 12/16/23 1526 Oral     SpO2 12/16/23 1526 93 %     Weight --      Height --      Head Circumference --      Peak Flow --      Pain Score 12/16/23 1527 8     Pain Loc --      Pain Education --      Exclude from Growth Chart --    No data found.  Updated Vital Signs BP 120/77 (BP Location: Right Arm)   Pulse 73   Temp 98.5 F (36.9 C) (Oral)   Resp 20   LMP 12/09/2023 (Approximate)   SpO2 93%   Visual Acuity Right Eye Distance:   Left Eye Distance:   Bilateral Distance:    Right Eye Near:   Left Eye Near:    Bilateral Near:     Physical Exam Vitals and nursing note reviewed.  Constitutional:      Appearance: Normal appearance.  HENT:     Head: Atraumatic.     Right Ear: Tympanic membrane and  external ear normal.     Left Ear: Tympanic membrane and external ear normal.     Nose: Rhinorrhea present.     Mouth/Throat:     Mouth: Mucous membranes are moist.     Pharynx: Posterior oropharyngeal erythema present.  Eyes:     Extraocular Movements: Extraocular movements intact.     Conjunctiva/sclera: Conjunctivae normal.  Cardiovascular:  Rate and Rhythm: Normal rate and regular rhythm.     Heart sounds: Normal heart sounds.  Pulmonary:     Effort: Pulmonary effort is normal.     Breath sounds: Wheezing present. No rales.  Musculoskeletal:        General: Normal range of motion.     Cervical back: Normal range of motion and neck supple.  Skin:    General: Skin is warm and dry.  Neurological:     Mental Status: She is alert and oriented to person, place, and time.  Psychiatric:        Mood and Affect: Mood normal.        Thought Content: Thought content normal.      UC Treatments / Results  Labs (all labs ordered are listed, but only abnormal results are displayed) Labs Reviewed  POC SARS CORONAVIRUS 2 AG -  ED    EKG   Radiology No results found.  Procedures Procedures (including critical care time)  Medications Ordered in UC Medications  dexamethasone  (DECADRON ) injection 10 mg (10 mg Intramuscular Given 12/16/23 1653)    Initial Impression / Assessment and Plan / UC Course  I have reviewed the triage vital signs and the nursing notes.  Pertinent labs & imaging results that were available during my care of the patient were reviewed by me and considered in my medical decision making (see chart for details).     Vital signs within normal limits today, rapid COVID-negative.  Suspect viral upper respiratory infection causing asthma exacerbation.  Does also have some uncontrolled seasonal allergies.  Will restart allergy regimen of Zyrtec  and Astelin , Phenergan DM for cough, IM Decadron , albuterol  as needed.  Return for worsening symptoms.  Final  Clinical Impressions(s) / UC Diagnoses   Final diagnoses:  Viral URI with cough  Moderate persistent asthma with acute exacerbation  Seasonal allergic rhinitis due to other allergic trigger     Discharge Instructions      Your COVID test was negative today.  I suspect either a viral illness or seasonal allergy flareup causing an asthma exacerbation.  I have started you on an allergy regimen of a Zyrtec  and nasal spray daily, continue your inhaler regimen including albuterol  every 4 hours as needed.  We have also given you a steroid shot today to help with your symptoms.  ED Prescriptions     Medication Sig Dispense Auth. Provider   promethazine-dextromethorphan (PROMETHAZINE-DM) 6.25-15 MG/5ML syrup Take 5 mLs by mouth 4 (four) times daily as needed. 100 mL Corbin Dess, PA-C   cetirizine  (ZYRTEC  ALLERGY) 10 MG tablet Take 1 tablet (10 mg total) by mouth daily. 30 tablet Corbin Dess, PA-C   azelastine  (ASTELIN ) 0.1 % nasal spray Place 1 spray into both nostrils 2 (two) times daily. Use in each nostril as directed 30 mL Corbin Dess, PA-C      PDMP not reviewed this encounter.   Corbin Dess, [place] 12/19/23 1815

## 2023-12-21 LAB — URINE CULTURE: Culture: >=100000 — AB

## 2023-12-22 ENCOUNTER — Ambulatory Visit (HOSPITAL_COMMUNITY): Payer: Self-pay

## 2024-01-07 ENCOUNTER — Ambulatory Visit
Admission: EM | Admit: 2024-01-07 | Discharge: 2024-01-07 | Disposition: A | Discharge status: Home / Self Care | Attending: Family Medicine | Admitting: Family Medicine

## 2024-01-07 ENCOUNTER — Ambulatory Visit

## 2024-01-07 DIAGNOSIS — J4541 Moderate persistent asthma with (acute) exacerbation: Secondary | ICD-10-CM

## 2024-01-07 MED ORDER — PREDNISONE 20 MG PO TABS: 40.0000 mg | ORAL_TABLET | Freq: Every day | ORAL | 0 refills | Status: DC

## 2024-01-07 MED ORDER — BREZTRI AEROSPHERE 160-9-4.8 MCG/ACT IN AERO: 2.0000 | INHALATION_SPRAY | Freq: Two times a day (BID) | RESPIRATORY_TRACT | 2 refills | Status: DC

## 2024-01-07 MED ORDER — PROMETHAZINE-DM 6.25-15 MG/5ML PO SYRP: 5.0000 mL | ORAL_SOLUTION | Freq: Four times a day (QID) | ORAL | 0 refills | Status: AC | PRN

## 2024-01-07 MED ORDER — ALBUTEROL SULFATE HFA 108 (90 BASE) MCG/ACT IN AERS: 2.0000 | INHALATION_SPRAY | RESPIRATORY_TRACT | 2 refills | Status: DC | PRN

## 2024-01-07 MED ORDER — ALBUTEROL SULFATE (2.5 MG/3ML) 0.083% IN NEBU
2.5000 mg | INHALATION_SOLUTION | Freq: Once | RESPIRATORY_TRACT | Status: AC
  Administered 2024-01-07: 2.5 mg via RESPIRATORY_TRACT

## 2024-01-07 NOTE — ED Provider Notes (Signed)
 [facility]    CSN: [id number] Arrival date & time: 01/07/24  1606      History   Chief Complaint No chief complaint on file.   HPI Jacqueline [name] is a 23 y.o. female.   Presenting today with 1 week history of cough, wheezing, chest tightness.  Denies fever, chills, chest pain, shortness of breath, abdominal pain, vomiting, diarrhea.  History of asthma, states that she has run out of her inhalers.  So far not trying anything over-the-counter for symptoms.    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   Severe persistent asthma without complication 01/28/2020   Migraine without aura and without status migrainosus, not intractable 10/26/2018   Anxiety and depression    Asthma, not well controlled, severe persistent, with acute exacerbation 04/27/2018   Self-injurious behavior 04/04/2018   MDD (major depressive disorder), recurrent, severe, 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   Acute respiratory failure, unsp w hypoxia or hypercapnia (HCC) 01/09/2017    Past Surgical History:  Procedure Laterality Date   GANGLION CYST EXCISION Right 04/26/2021   Procedure: right dorsal carpal ganglion cyst excision;  Surgeon: Ltanya Rummer, MD;  Location: [facility] OR;  Service: Orthopedics;  Laterality: Right;  with MAC anesthesia needs 30 minutes   INCISION AND DRAINAGE WOUND WITH NAILBED REPAIR Left 04/26/2021   Procedure: INCISION AND DRAINAGE WOUND WITH NAILBED REPAIR LEFT MIDDLE FINGER;  Surgeon: Ltanya Rummer, MD;  Location: [facility] OR;  Service: Orthopedics;  Laterality: Left;   UMBILICAL HERNIA REPAIR      OB  History   No obstetric history on file.      Home Medications    Prior to Admission medications   Medication Sig Start Date End Date Taking? Authorizing Provider  predniSONE  (DELTASONE ) 20 MG tablet Take 2 tablets (40 mg total) by mouth daily with breakfast. 01/07/24  Yes Corbin Dess, PA-C  promethazine -dextromethorphan (PROMETHAZINE -DM) 6.25-15 MG/5ML syrup Take 5 mLs by mouth 4 (four) times daily as needed. 01/07/24  Yes Corbin Dess, PA-C  acetaminophen  (TYLENOL ) 500 MG tablet Take 1 tablet (500 mg total) by mouth every 6 (six) hours as needed. 03/05/23   Debbra Fairy, PA-C  albuterol  (PROVENTIL ) (2.5 MG/3ML) 0.083% nebulizer solution Take 3 mLs (2.5 mg total) by nebulization every 6 (six) hours as needed for wheezing or shortness of breath. 08/12/23   Wilhemena Harbour, NP  albuterol  (VENTOLIN  HFA) 108 (90 Base) MCG/ACT inhaler Inhale 2 puffs into the lungs every 4 (four) hours as needed for wheezing or shortness of breath. 01/07/24   Corbin Dess, PA-C  azelastine  (ASTELIN ) 0.1 % nasal spray Place 1 spray into both nostrils 2 (two) times daily. Use in each nostril as directed 12/16/23   Corbin Dess, PA-C  BREZTRI  AEROSPHERE 160-9-4.8 MCG/ACT AERO inhaler Inhale 2 puffs into the lungs in the morning and at bedtime. 01/07/24   Corbin Dess, PA-C  cetirizine  (ZYRTEC  ALLERGY) 10 MG tablet Take 1 tablet (10 mg total) by mouth daily. 12/16/23   Corbin Dess, PA-C  EPIPEN  2-PAK 0.3 MG/0.3ML  SOAJ injection Use as directed for life threatening allergic reactions Patient not taking: Reported on 12/16/2023 02/06/23   Brian Campanile, MD  nitrofurantoin , macrocrystal-monohydrate, (MACROBID ) 100 MG capsule Take 1 capsule (100 mg total) by mouth 2 (two) times daily. 12/18/23   Leath-Warren, Belen Bowers, NP  promethazine -dextromethorphan (PROMETHAZINE -DM) 6.25-15 MG/5ML syrup Take 5 mLs by mouth 4 (four) times daily as needed. 12/16/23   Corbin Dess, PA-C  triamcinolone  cream (KENALOG ) 0.1 % Apply 1 Application topically 2 (two) times daily. 10/27/23   Gregoria Leas, NP    Family History Family History  Problem Relation Age of Onset   Allergic rhinitis Mother    Asthma Mother    COPD Mother    Migraines Mother    Allergic rhinitis Father    Asthma Father    Schizophrenia Father    Asthma Sister    Asthma Brother    Migraines Brother     Social History Social History   Tobacco Use   Smoking status: Some Days    Current packs/day: 0.00    Average packs/day: 0.3 packs/day for 4.0 years (1.0 ttl pk-yrs)    Types: Cigarettes, Cigars    Start date: 11/06/2015    Last attempt to quit: 11/06/2019    Years since quitting: 4.1   Smokeless tobacco: Never   Tobacco comments:    Patient smokes black n milds  Vaping Use   Vaping status: Never Used  Substance Use Topics   Alcohol use: Not Currently   Drug use: Not Currently    Types: Marijuana    Comment: rarely     Allergies   Bee venom, Egg-derived products, Other, Peanut-containing drug products, and Pollen extract   Review of Systems Review of Systems Per HPI  Physical Exam Triage Vital Signs ED Triage Vitals  Encounter Vitals Group     BP 01/07/24 1642 (!) 141/93     Girls Systolic BP Percentile --      Girls Diastolic BP Percentile --      Boys Systolic BP Percentile --      Boys Diastolic BP Percentile --      Pulse Rate 01/07/24 1642 91     Resp 01/07/24 1642 18     Temp 01/07/24 1642 98.6 F (37 C)     Temp Source 01/07/24 1642 Oral     SpO2 01/07/24 1642 91 %     Weight --      Height --      Head Circumference --      Peak Flow --      Pain Score 01/07/24 1645 0     Pain Loc --      Pain Education --      Exclude from Growth Chart --    No data found.  Updated Vital Signs BP (!) 141/93 (BP Location: Right Arm)   Pulse 91   Temp 98.6 F (37 C) (Oral)   Resp 18   LMP 12/09/2023 (Approximate)   SpO2 91%   Visual  Acuity Right Eye Distance:   Left Eye Distance:   Bilateral Distance:    Right Eye Near:   Left Eye Near:    Bilateral Near:     Physical Exam Vitals and nursing note reviewed.  Constitutional:      Appearance: Normal appearance. She is not ill-appearing.  HENT:     Head: Atraumatic.     Nose: Nose normal.     Mouth/Throat:     Mouth:  Mucous membranes are moist.     Pharynx: Oropharynx is clear.   Eyes:     Extraocular Movements: Extraocular movements intact.     Conjunctiva/sclera: Conjunctivae normal.    Cardiovascular:     Rate and Rhythm: Normal rate and regular rhythm.     Heart sounds: Normal heart sounds.  Pulmonary:     Effort: Pulmonary effort is normal.     Breath sounds: Wheezing present. No rales.   Musculoskeletal:        General: Normal range of motion.     Cervical back: Normal range of motion and neck supple.   Skin:    General: Skin is warm and dry.   Neurological:     Mental Status: She is alert and oriented to person, place, and time.   Psychiatric:        Mood and Affect: Mood normal.        Thought Content: Thought content normal.        Judgment: Judgment normal.      UC Treatments / Results  Labs (all labs ordered are listed, but only abnormal results are displayed) Labs Reviewed - No data to display  EKG   Radiology No results found.  Procedures Procedures (including critical care time)  Medications Ordered in UC Medications  albuterol  (PROVENTIL ) (2.5 MG/3ML) 0.083% nebulizer solution 2.5 mg (2.5 mg Nebulization Given 01/07/24 1743)    Initial Impression / Assessment and Plan / UC Course  I have reviewed the triage vital signs and the nursing notes.  Pertinent labs & imaging results that were available during my care of the patient were reviewed by me and considered in my medical decision making (see chart for details).     Consistent with asthma exacerbation.  Albuterol  nebulizer treatment given prior to discharge,  will refill both rescue and maintenance inhaler, treat with prednisone , Phenergan  DM, supportive over-the-counter medications and home care.  Return for worsening symptoms.  Final Clinical Impressions(s) / UC Diagnoses   Final diagnoses:  Moderate persistent asthma with acute exacerbation   Discharge Instructions   None    ED Prescriptions     Medication Sig Dispense Auth. Provider   predniSONE  (DELTASONE ) 20 MG tablet Take 2 tablets (40 mg total) by mouth daily with breakfast. 10 tablet Corbin Dess, PA-C   promethazine -dextromethorphan (PROMETHAZINE -DM) 6.25-15 MG/5ML syrup Take 5 mLs by mouth 4 (four) times daily as needed. 100 mL Corbin Dess, PA-C   albuterol  (VENTOLIN  HFA) 108 (90 Base) MCG/ACT inhaler Inhale 2 puffs into the lungs every 4 (four) hours as needed for wheezing or shortness of breath. 18 g Corbin Dess, [place]   BREZTRI  AEROSPHERE 160-9-4.8 MCG/ACT AERO inhaler Inhale 2 puffs into the lungs in the morning and at bedtime. 10.7 g Corbin Dess, [place]      PDMP not reviewed this encounter.   Corbin Dess, [place] 01/07/24 [phone]

## 2024-01-07 NOTE — ED Triage Notes (Signed)
 Pt reports cough and asthma flare up x 1 week. Has been seen previously for same sx's

## 2024-01-21 ENCOUNTER — Ambulatory Visit
Admission: RE | Admit: 2024-01-21 | Discharge: 2024-01-21 | Disposition: A | Discharge status: Home / Self Care | Source: Ambulatory Visit | Attending: Family Medicine | Admitting: Family Medicine

## 2024-01-21 VITALS — BP 127/85 | HR 71 | Temp 98.2°F | Resp 18

## 2024-01-21 DIAGNOSIS — Z8619 Personal history of other infectious and parasitic diseases: Secondary | ICD-10-CM | POA: Insufficient documentation

## 2024-01-21 DIAGNOSIS — Z113 Encounter for screening for infections with a predominantly sexual mode of transmission: Secondary | ICD-10-CM | POA: Insufficient documentation

## 2024-01-21 NOTE — ED Provider Notes (Signed)
 [facility]    CSN: [id number] Arrival date & time: 01/21/24  1845      History   Chief Complaint Chief Complaint  Patient presents with   SEXUALLY TRANSMITTED DISEASE    Just getting retested - Entered by patient    HPI Jacqueline [name] is a 23 y.o. female.   Patient presenting today requesting retesting following a positive trichomonas test on 12/08/2023.  She states she completed the dose of Flagyl  and has had no new sexual partners.  Currently asymptomatic.    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   Severe persistent asthma without complication 01/28/2020   Migraine without aura and without status migrainosus, not intractable 10/26/2018   Anxiety and depression    Asthma, not well controlled, severe persistent, with acute exacerbation 04/27/2018   Self-injurious behavior 04/04/2018   MDD (major depressive disorder), recurrent, severe, 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   Acute respiratory failure, unsp w hypoxia or hypercapnia (HCC) 01/09/2017    Past Surgical History:  Procedure Laterality Date   GANGLION CYST EXCISION Right 04/26/2021   Procedure: right dorsal carpal ganglion cyst excision;  Surgeon: Alyse Agent, MD;  Location: [facility] OR;  Service: Orthopedics;  Laterality: Right;  with MAC anesthesia needs 30 minutes   INCISION AND DRAINAGE WOUND WITH NAILBED REPAIR Left 04/26/2021   Procedure: INCISION AND DRAINAGE WOUND WITH NAILBED REPAIR LEFT MIDDLE FINGER;  Surgeon: Alyse Agent, MD;  Location: [facility] OR;  Service: Orthopedics;  Laterality: Left;   UMBILICAL HERNIA REPAIR       OB History   No obstetric history on file.      Home Medications    Prior to Admission medications   Medication Sig Start Date End Date Taking? Authorizing Provider  acetaminophen  (TYLENOL ) 500 MG tablet Take 1 tablet (500 mg total) by mouth every 6 (six) hours as needed. 03/05/23   Nivia Colon, PA-C  albuterol  (PROVENTIL ) (2.5 MG/3ML) 0.083% nebulizer solution Take 3 mLs (2.5 mg total) by nebulization every 6 (six) hours as needed for wheezing or shortness of breath. 08/12/23   Chandra Harlene LABOR, NP  albuterol  (VENTOLIN  HFA) 108 (90 Base) MCG/ACT inhaler Inhale 2 puffs into the lungs every 4 (four) hours as needed for wheezing or shortness of breath. 01/07/24   Stuart Vernell Norris, PA-C  azelastine  (ASTELIN ) 0.1 % nasal spray Place 1 spray into both nostrils 2 (two) times daily. Use in each nostril as directed 12/16/23   Stuart Vernell Norris, PA-C  BREZTRI  AEROSPHERE 160-9-4.8 MCG/ACT AERO inhaler Inhale 2 puffs into the lungs in the morning and at bedtime. 01/07/24   Stuart Vernell Norris, PA-C  cetirizine  (ZYRTEC  ALLERGY) 10 MG tablet Take 1 tablet (10 mg total) by mouth daily. 12/16/23   Stuart Vernell Norris, PA-C  EPIPEN  2-PAK 0.3 MG/0.3ML SOAJ injection Use as directed for life threatening allergic reactions Patient not taking: Reported on 12/16/2023 02/06/23   Jeneal Danita Macintosh, MD  nitrofurantoin , macrocrystal-monohydrate, (MACROBID ) 100 MG capsule Take 1 capsule (100 mg total) by mouth 2 (two) times daily. 12/18/23   Leath-Warren,  Etta PARAS, NP  predniSONE  (DELTASONE ) 20 MG tablet Take 2 tablets (40 mg total) by mouth daily with breakfast. 01/07/24   Stuart Vernell Norris, PA-C  promethazine -dextromethorphan (PROMETHAZINE -DM) 6.25-15 MG/5ML syrup Take 5 mLs by mouth 4 (four) times daily as needed. 12/16/23   Stuart Vernell Norris, PA-C  promethazine -dextromethorphan (PROMETHAZINE -DM) 6.25-15 MG/5ML syrup Take 5 mLs by mouth 4 (four) times daily as needed. 01/07/24   Stuart Vernell Norris, PA-C  triamcinolone  cream (KENALOG ) 0.1 % Apply 1 Application topically 2 (two) times daily. 10/27/23   Myrna Camelia HERO, NP    Family History Family History  Problem Relation Age of Onset   Allergic rhinitis Mother    Asthma Mother    COPD Mother    Migraines Mother    Allergic rhinitis Father    Asthma Father    Schizophrenia Father    Asthma Sister    Asthma Brother    Migraines Brother     Social History Social History   Tobacco Use   Smoking status: Some Days    Current packs/day: 0.00    Average packs/day: 0.3 packs/day for 4.0 years (1.0 ttl pk-yrs)    Types: Cigarettes, Cigars    Start date: 11/06/2015    Last attempt to quit: 11/06/2019    Years since quitting: 4.2   Smokeless tobacco: Never   Tobacco comments:    Patient smokes black n milds  Vaping Use   Vaping status: Never Used  Substance Use Topics   Alcohol use: Not Currently   Drug use: Not Currently    Types: Marijuana    Comment: rarely     Allergies   Bee venom, Egg-derived products, Other, Peanut-containing drug products, and Pollen extract   Review of Systems Review of Systems Per HPI  Physical Exam Triage Vital Signs ED Triage Vitals  Encounter Vitals Group     BP 01/21/24 1908 127/85     Girls Systolic BP Percentile --      Girls Diastolic BP Percentile --      Boys Systolic BP Percentile --      Boys Diastolic BP Percentile --      Pulse Rate 01/21/24 1908 71     Resp 01/21/24 1908 18     Temp 01/21/24 1908 98.2 F (36.8 C)     Temp Source 01/21/24 1908 Oral     SpO2 01/21/24 1908 95 %     Weight --      Height --      Head Circumference --      Peak Flow --      Pain Score 01/21/24 1913 0     Pain Loc --      Pain Education --      Exclude from Growth Chart --    No data found.  Updated Vital Signs BP 127/85 (BP Location: Right Arm)   Pulse 71   Temp 98.2 F (36.8 C) (Oral)   Resp 18   LMP 01/07/2024 (Exact Date)   SpO2 95%   Visual  Acuity Right Eye Distance:   Left Eye Distance:   Bilateral Distance:    Right Eye Near:   Left Eye Near:    Bilateral Near:     Physical Exam Vitals and nursing note reviewed.  Constitutional:      Appearance: Normal appearance. She is not ill-appearing.  HENT:     Head: Atraumatic.  Eyes:     Extraocular Movements: Extraocular movements intact.     Conjunctiva/sclera:  Conjunctivae normal.  Cardiovascular:     Rate and Rhythm: Normal rate.  Pulmonary:     Effort: Pulmonary effort is normal.  Genitourinary:    Comments: GU exam deferred, self swab performed Musculoskeletal:        General: Normal range of motion.     Cervical back: Normal range of motion and neck supple.  Skin:    General: Skin is warm and dry.  Neurological:     Mental Status: She is alert and oriented to person, place, and time.  Psychiatric:        Mood and Affect: Mood normal.        Thought Content: Thought content normal.        Judgment: Judgment normal.      UC Treatments / Results  Labs (all labs ordered are listed, but only abnormal results are displayed) Labs Reviewed  HIV ANTIBODY (ROUTINE TESTING W REFLEX)  RPR  CERVICOVAGINAL ANCILLARY ONLY    EKG   Radiology No results found.  Procedures Procedures (including critical care time)  Medications Ordered in UC Medications - No data to display  Initial Impression / Assessment and Plan / UC Course  I have reviewed the triage vital signs and the nursing notes.  Pertinent labs & imaging results that were available during my care of the patient were reviewed by me and considered in my medical decision making (see chart for details).     Cytology swab pending for rescreening for STIs as well as HIV and syphilis labs per her request.  Treat based on results.    Final Clinical Impressions(s) / UC Diagnoses   Final diagnoses:  Screening examination for STI  History of trichomonal vaginitis   Discharge Instructions   None     ED Prescriptions   None    PDMP not reviewed this encounter.   Stuart Vernell Norris, [place] 01/21/24 1927

## 2024-01-21 NOTE — ED Triage Notes (Signed)
 Pt reports needing to be re-tested for STD's.

## 2024-01-22 LAB — RPR: RPR Ser Ql: NONREACTIVE

## 2024-01-22 LAB — HIV ANTIBODY (ROUTINE TESTING W REFLEX): HIV Screen 4th Generation wRfx: NONREACTIVE

## 2024-01-26 ENCOUNTER — Ambulatory Visit (HOSPITAL_COMMUNITY): Payer: Self-pay

## 2024-01-26 LAB — CERVICOVAGINAL ANCILLARY ONLY
Chlamydia: NEGATIVE
Comment: NEGATIVE
Comment: NEGATIVE
Comment: NORMAL
Neisseria Gonorrhea: NEGATIVE
Trichomonas: POSITIVE — AB

## 2024-01-26 MED ORDER — METRONIDAZOLE 500 MG PO TABS: 500.0000 mg | ORAL_TABLET | Freq: Two times a day (BID) | ORAL | 0 refills | Status: AC

## 2024-01-30 ENCOUNTER — Telehealth: Payer: Self-pay

## 2024-01-30 NOTE — Telephone Encounter (Signed)
 I called the patient in regard to needing an albuterol  inhaler. I was unable to leave a message for the patient to call back and schedule an appointment.

## 2024-02-03 NOTE — Telephone Encounter (Signed)
 I called patient to inform her to schedule. Patient was unavailable and had to leave message for a call back.

## 2024-02-05 NOTE — Telephone Encounter (Signed)
 I called the patient. I left a message with the mom to have her call the office back to schedule an appointment for further refills.

## 2024-03-21 ENCOUNTER — Ambulatory Visit

## 2024-04-05 ENCOUNTER — Ambulatory Visit

## 2024-04-08 ENCOUNTER — Encounter (HOSPITAL_COMMUNITY): Payer: Self-pay

## 2024-04-08 ENCOUNTER — Emergency Department (HOSPITAL_COMMUNITY)

## 2024-04-08 ENCOUNTER — Emergency Department (HOSPITAL_COMMUNITY)
Admission: EM | Admit: 2024-04-08 | Discharge: 2024-04-08 | Disposition: A | Discharge status: Home / Self Care | Attending: Emergency Medicine | Admitting: Emergency Medicine

## 2024-04-08 ENCOUNTER — Other Ambulatory Visit: Payer: Self-pay

## 2024-04-08 DIAGNOSIS — J4521 Mild intermittent asthma with (acute) exacerbation: Secondary | ICD-10-CM | POA: Insufficient documentation

## 2024-04-08 DIAGNOSIS — R0602 Shortness of breath: Secondary | ICD-10-CM | POA: Diagnosis present

## 2024-04-08 MED ORDER — DEXAMETHASONE 4 MG PO TABS
16.0000 mg | ORAL_TABLET | Freq: Once | ORAL | Status: AC
  Administered 2024-04-08: 16 mg via ORAL
  Filled 2024-04-08: qty 4

## 2024-04-08 MED ORDER — IPRATROPIUM-ALBUTEROL 0.5-2.5 (3) MG/3ML IN SOLN
6.0000 mL | Freq: Once | RESPIRATORY_TRACT | Status: AC
  Administered 2024-04-08: 6 mL via RESPIRATORY_TRACT
  Filled 2024-04-08: qty 6

## 2024-04-08 MED ORDER — DEXAMETHASONE 4 MG PO TABS: 8.0000 mg | ORAL_TABLET | Freq: Once | ORAL | Status: DC

## 2024-04-08 MED ORDER — IPRATROPIUM-ALBUTEROL 0.5-2.5 (3) MG/3ML IN SOLN: 3.0000 mL | Freq: Once | RESPIRATORY_TRACT | Status: DC

## 2024-04-08 MED ORDER — ALBUTEROL SULFATE HFA 108 (90 BASE) MCG/ACT IN AERS
2.0000 | INHALATION_SPRAY | Freq: Once | RESPIRATORY_TRACT | Status: AC
Start: 2024-04-08 — End: 2024-04-08
  Administered 2024-04-08: 2 via RESPIRATORY_TRACT
  Filled 2024-04-08: qty 6.7

## 2024-04-08 NOTE — ED Provider Notes (Signed)
 [place] EMERGENCY DEPARTMENT AT [facility] Provider Note   CSN: [id number] Arrival date & time: 04/08/24  0000     History Chief Complaint  Patient presents with   Asthma    HPI Jacqueline [name] is a 23 y.o. female presenting for chief complaint of SOB. States that she has a history of asthma States that she ran out of he rbrestree Severe SOB at work today.   Patient's recorded medical, surgical, social, medication list and allergies were reviewed in the Snapshot window as part of the initial history.   Review of Systems   Review of Systems  Constitutional:  Negative for chills and fever.  HENT:  Negative for ear pain and sore throat.   Eyes:  Negative for pain and visual disturbance.  Respiratory:  Positive for shortness of breath and wheezing. Negative for cough.   Cardiovascular:  Negative for chest pain and palpitations.  Gastrointestinal:  Negative for abdominal pain and vomiting.  Genitourinary:  Negative for dysuria and hematuria.  Musculoskeletal:  Negative for arthralgias and back pain.  Skin:  Negative for color change and rash.  Neurological:  Negative for seizures and syncope.  All other systems reviewed and are negative.   Physical Exam Updated Vital Signs BP 135/78   Pulse 92   Temp 98 F (36.7 C)   Resp 18   Wt 85 kg   LMP  (LMP Unknown)   SpO2 98%   BMI 34.27 kg/m  Physical Exam Vitals and nursing note reviewed.  Constitutional:      General: She is not in acute distress.    Appearance: She is well-developed.  HENT:     Head: Normocephalic and atraumatic.  Eyes:     Conjunctiva/sclera: Conjunctivae normal.  Cardiovascular:     Rate and Rhythm: Normal rate and regular rhythm.     Heart sounds: No murmur heard. Pulmonary:     Effort: Pulmonary effort is normal. No respiratory distress.     Breath sounds: Wheezing present.  Abdominal:     General: There is no distension.     Palpations: Abdomen is soft.     Tenderness:  There is no abdominal tenderness. There is no right CVA tenderness or left CVA tenderness.  Musculoskeletal:        General: No swelling or tenderness. Normal range of motion.     Cervical back: Neck supple.  Skin:    General: Skin is warm and dry.  Neurological:     General: No focal deficit present.     Mental Status: She is alert and oriented to person, place, and time. Mental status is at baseline.     Cranial Nerves: No cranial nerve deficit.      ED Course/ Medical Decision Making/ A&P    Procedures Procedures   Medications Ordered in ED Medications  albuterol  (VENTOLIN  HFA) 108 (90 Base) MCG/ACT inhaler 2 puff (2 puffs Inhalation Provided for home use 04/08/24 0140)  dexamethasone  (DECADRON ) tablet 16 mg (16 mg Oral Given 04/08/24 0158)  ipratropium-albuterol  (DUONEB) 0.5-2.5 (3) MG/3ML nebulizer solution 6 mL (6 mLs Nebulization Given 04/08/24 9862)   Medical Decision Making:   Jacqueline [name] is a 23 y.o. female with a history of asthma, who presented to the ED today with acute SOB. Relatively acute onset in the setting of URI sxs. On my initial exam, the pt was SOB and tachypneic. Audible wheezing and grossly decreased breath sounds appreciated.  Reviewed and confirmed nursing documentation for past medical  history, family history, social history.    Initial Assessment:   With the patient's presentation of SOB in the above setting, most likely diagnosis is Asthma Exacerbation. Other diagnoses were considered including (but not limited to) CAP, PE, ACS, viral infection, PTX. These are considered less likely due to history of present illness and physical exam findings.   This is most consistent with an acute life/limb threatening illness complicated by underlying chronic conditions.  Initial Plan:  Empiric treatment of patient's symptoms with immediate initiation of inhaled bronchodilators and PO steroids.  Evaluation for infectious versus intrathoracic abnormality with  chest x-ray  Objective evaluation as below reviewed   Initial Study Results:   Radiology:  All images reviewed independently. Agree with radiology report at this time.   DG Chest Portable 1 View Result Date: 04/08/2024 CLINICAL DATA:  Shortness of breath with blood-streaked sputum. EXAM: PORTABLE CHEST 1 VIEW COMPARISON:  Chest PA Lat 01/14/2023 FINDINGS: The heart size and mediastinal contours are within normal limits. Both lungs are clear. The visualized skeletal structures are unremarkable. IMPRESSION: No active disease. Electronically Signed   By: Francis Quam M.D.   On: 04/08/2024 01:45     Final Assessment and Plan:   After initiation of medical therapies, patient is grossly improved and no longer in acute distress.   Disposition:  I have considered need for hospitalization, however, considering all of the above, I believe this patient is stable for discharge at this time.  Patient/family educated about specific return precautions for given chief complaint and symptoms.  Patient/family educated about follow-up with PCP.     Patient/family expressed understanding of return precautions and need for follow-up. Patient spoken to regarding all imaging and laboratory results and appropriate follow up for these results. All education provided in verbal form with additional information in written form. Time was allowed for answering of patient questions. Patient discharged.    Emergency Department Medication Summary:   Medications  albuterol  (VENTOLIN  HFA) 108 (90 Base) MCG/ACT inhaler 2 puff (2 puffs Inhalation Provided for home use 04/08/24 0140)  dexamethasone  (DECADRON ) tablet 16 mg (16 mg Oral Given 04/08/24 0158)  ipratropium-albuterol  (DUONEB) 0.5-2.5 (3) MG/3ML nebulizer solution 6 mL (6 mLs Nebulization Given 04/08/24 0137)            Clinical Impression:  1. Mild intermittent asthma with exacerbation      Discharge    Clinical Impression:  1. Mild intermittent asthma  with exacerbation      Discharge   Final Clinical Impression(s) / ED Diagnoses Final diagnoses:  Mild intermittent asthma with exacerbation    Rx / DC Orders ED Discharge Orders     None         Jerral Meth, MD 04/08/24 [phone]

## 2024-04-08 NOTE — Progress Notes (Signed)
 Albuterol  inhaler and spacer education provided and complete with patient for home use.

## 2024-04-08 NOTE — ED Triage Notes (Signed)
 Pt to ED with reports of asthma flare up that started about a week ago, pt is out of asthma meds.

## 2024-04-09 ENCOUNTER — Ambulatory Visit

## 2024-05-17 ENCOUNTER — Encounter (HOSPITAL_COMMUNITY): Payer: Self-pay

## 2024-05-17 ENCOUNTER — Other Ambulatory Visit: Payer: Self-pay

## 2024-05-17 ENCOUNTER — Emergency Department (HOSPITAL_COMMUNITY)

## 2024-05-17 ENCOUNTER — Emergency Department (HOSPITAL_COMMUNITY)
Admission: EM | Admit: 2024-05-17 | Discharge: 2024-05-17 | Disposition: A | Discharge status: Home / Self Care | Attending: Emergency Medicine | Admitting: Emergency Medicine

## 2024-05-17 DIAGNOSIS — J4521 Mild intermittent asthma with (acute) exacerbation: Secondary | ICD-10-CM | POA: Insufficient documentation

## 2024-05-17 DIAGNOSIS — J069 Acute upper respiratory infection, unspecified: Secondary | ICD-10-CM | POA: Insufficient documentation

## 2024-05-17 DIAGNOSIS — Z91010 Allergy to peanuts: Secondary | ICD-10-CM | POA: Insufficient documentation

## 2024-05-17 DIAGNOSIS — F17200 Nicotine dependence, unspecified, uncomplicated: Secondary | ICD-10-CM | POA: Insufficient documentation

## 2024-05-17 DIAGNOSIS — R059 Cough, unspecified: Secondary | ICD-10-CM | POA: Diagnosis present

## 2024-05-17 LAB — RESP PANEL BY RT-PCR (RSV, FLU A&B, COVID)  RVPGX2
Influenza A by PCR: NEGATIVE
Influenza B by PCR: NEGATIVE
Resp Syncytial Virus by PCR: NEGATIVE
SARS Coronavirus 2 by RT PCR: NEGATIVE

## 2024-05-17 LAB — CBC WITH DIFFERENTIAL/PLATELET
Abs Immature Granulocytes: 0.02 K/uL (ref 0.00–0.07)
Basophils Absolute: 0 K/uL (ref 0.0–0.1)
Basophils Relative: 0 %
Eosinophils Absolute: 0.4 K/uL (ref 0.0–0.5)
Eosinophils Relative: 5 %
HCT: 39.4 % (ref 36.0–46.0)
Hemoglobin: 12.9 g/dL (ref 12.0–15.0)
Immature Granulocytes: 0 %
Lymphocytes Relative: 37 %
Lymphs Abs: 3.1 K/uL (ref 0.7–4.0)
MCH: 30.6 pg (ref 26.0–34.0)
MCHC: 32.7 g/dL (ref 30.0–36.0)
MCV: 93.4 fL (ref 80.0–100.0)
Monocytes Absolute: 0.7 K/uL (ref 0.1–1.0)
Monocytes Relative: 8 %
Neutro Abs: 4.1 K/uL (ref 1.7–7.7)
Neutrophils Relative %: 50 %
Platelets: 318 K/uL (ref 150–400)
RBC: 4.22 MIL/uL (ref 3.87–5.11)
RDW: 12.3 % (ref 11.5–15.5)
WBC: 8.4 K/uL (ref 4.0–10.5)
nRBC: 0 % (ref 0.0–0.2)

## 2024-05-17 LAB — BASIC METABOLIC PANEL WITH GFR
Anion gap: 9 (ref 5–15)
BUN: 11 mg/dL (ref 6–20)
CO2: 25 mmol/L (ref 22–32)
Calcium: 8.9 mg/dL (ref 8.9–10.3)
Chloride: 105 mmol/L (ref 98–111)
Creatinine, Ser: 0.57 mg/dL (ref 0.44–1.00)
GFR, Estimated: >60 mL/min (ref >60)
Glucose, Bld: 96 mg/dL (ref 70–99)
Potassium: 4.3 mmol/L (ref 3.5–5.1)
Sodium: 139 mmol/L (ref 135–145)

## 2024-05-17 MED ORDER — ALBUTEROL SULFATE (2.5 MG/3ML) 0.083% IN NEBU
2.5000 mg | INHALATION_SOLUTION | Freq: Once | RESPIRATORY_TRACT | Status: AC
  Administered 2024-05-17: 2.5 mg via RESPIRATORY_TRACT

## 2024-05-17 MED ORDER — BREZTRI AEROSPHERE 160-9-4.8 MCG/ACT IN AERO: 2.0000 | INHALATION_SPRAY | Freq: Two times a day (BID) | RESPIRATORY_TRACT | 0 refills | Status: AC

## 2024-05-17 MED ORDER — AEROCHAMBER PLUS FLO-VU MEDIUM MISC
1.0000 | Freq: Once | Status: AC
  Administered 2024-05-17: 1

## 2024-05-17 MED ORDER — IPRATROPIUM-ALBUTEROL 0.5-2.5 (3) MG/3ML IN SOLN
3.0000 mL | Freq: Once | RESPIRATORY_TRACT | Status: AC
  Administered 2024-05-17: 3 mL via RESPIRATORY_TRACT
  Filled 2024-05-17: qty 3

## 2024-05-17 MED ORDER — ALBUTEROL SULFATE HFA 108 (90 BASE) MCG/ACT IN AERS: 2.0000 | INHALATION_SPRAY | RESPIRATORY_TRACT | 0 refills | Status: AC | PRN

## 2024-05-17 MED ORDER — ALBUTEROL SULFATE (2.5 MG/3ML) 0.083% IN NEBU: 2.5000 mg | INHALATION_SOLUTION | Freq: Four times a day (QID) | RESPIRATORY_TRACT | 0 refills | Status: AC | PRN

## 2024-05-17 MED ORDER — METHYLPREDNISOLONE SODIUM SUCC 125 MG IJ SOLR
125.0000 mg | Freq: Once | INTRAMUSCULAR | Status: AC
  Administered 2024-05-17: 125 mg via INTRAVENOUS
  Filled 2024-05-17: qty 2

## 2024-05-17 MED ORDER — PREDNISONE 50 MG PO TABS: 50.0000 mg | ORAL_TABLET | Freq: Every day | ORAL | 0 refills | Status: DC

## 2024-05-17 MED ORDER — ALBUTEROL SULFATE HFA 108 (90 BASE) MCG/ACT IN AERS
1.0000 | INHALATION_SPRAY | RESPIRATORY_TRACT | Status: DC | PRN
  Administered 2024-05-17: 2 via RESPIRATORY_TRACT
  Filled 2024-05-17: qty 6.7

## 2024-05-17 NOTE — ED Triage Notes (Signed)
 Pt arrives with c/o SOB and wheezing that started week. Pt reports cough, congestion, and sore throat today. Pt has hx of asthma. Pt has inspiratory and expiratory wheezing in triage. Pt report tightness in her chest.

## 2024-05-17 NOTE — Discharge Instructions (Addendum)
 DO NOT SMOKE

## 2024-05-17 NOTE — ED Provider Notes (Signed)
 [place] EMERGENCY DEPARTMENT AT [facility] Provider Note   CSN: [id number] Arrival date & time: 05/17/24  2032     Patient presents with: Shortness of Breath   Jacqueline [name] is a 23 y.o. female.   Pt is a 23 yo with pmhx significant for asthma, depression, adhd, and allergies.  Pt is out of her inhaler and has some sob and wheezing that's been going on for a week.  She's had cough, congestion, and felt like she had a fever last night.  She does smoke.       Prior to Admission medications   Medication Sig Start Date End Date Taking? Authorizing Provider  predniSONE  (DELTASONE ) 50 MG tablet Take 1 tablet (50 mg total) by mouth daily with breakfast. 05/17/24  Yes Dean Clarity, MD  acetaminophen  (TYLENOL ) 500 MG tablet Take 1 tablet (500 mg total) by mouth every 6 (six) hours as needed. 03/05/23   Nivia Colon, PA-C  albuterol  (PROVENTIL ) (2.5 MG/3ML) 0.083% nebulizer solution Take 3 mLs (2.5 mg total) by nebulization every 6 (six) hours as needed for wheezing or shortness of breath. 05/17/24   Dean Clarity, MD  albuterol  (VENTOLIN  HFA) 108 (90 Base) MCG/ACT inhaler Inhale 2 puffs into the lungs every 4 (four) hours as needed for wheezing or shortness of breath. 05/17/24   Dean Clarity, MD  azelastine  (ASTELIN ) 0.1 % nasal spray Place 1 spray into both nostrils 2 (two) times daily. Use in each nostril as directed 12/16/23   Stuart Vernell Norris, PA-C  BREZTRI  AEROSPHERE 160-9-4.8 MCG/ACT AERO inhaler Inhale 2 puffs into the lungs in the morning and at bedtime. 05/17/24   Dean Clarity, MD  cetirizine  (ZYRTEC  ALLERGY) 10 MG tablet Take 1 tablet (10 mg total) by mouth daily. 12/16/23   Stuart Vernell Norris, PA-C  EPIPEN  2-PAK 0.3 MG/0.3ML SOAJ injection Use as directed for life threatening allergic reactions Patient not taking: Reported on 12/16/2023 02/06/23   Jeneal Danita Macintosh, MD  metroNIDAZOLE  (FLAGYL ) 500 MG tablet Take 1 tablet (500 mg total) by  mouth 2 (two) times daily. 01/26/24   Vonna Sharlet POUR, MD  nitrofurantoin , macrocrystal-monohydrate, (MACROBID ) 100 MG capsule Take 1 capsule (100 mg total) by mouth 2 (two) times daily. 12/18/23   Leath-Warren, Etta PARAS, NP  predniSONE  (DELTASONE ) 20 MG tablet Take 2 tablets (40 mg total) by mouth daily with breakfast. 01/07/24   Stuart Vernell Norris, PA-C  promethazine -dextromethorphan (PROMETHAZINE -DM) 6.25-15 MG/5ML syrup Take 5 mLs by mouth 4 (four) times daily as needed. 12/16/23   Stuart Vernell Norris, PA-C  promethazine -dextromethorphan (PROMETHAZINE -DM) 6.25-15 MG/5ML syrup Take 5 mLs by mouth 4 (four) times daily as needed. 01/07/24   Stuart Vernell Norris, PA-C  triamcinolone  cream (KENALOG ) 0.1 % Apply 1 Application topically 2 (two) times daily. 10/27/23   Myrna Camelia HERO, NP    Allergies: Bee venom, Egg protein-containing drug products, Other, Peanut-containing drug products, and Pollen extract    Review of Systems  Constitutional:  Positive for fever.  HENT:  Positive for congestion.   Respiratory:  Positive for cough and wheezing.   All other systems reviewed and are negative.   Updated Vital Signs BP (!) 153/121 (BP Location: Right Arm)   Pulse 77   Temp 98.2 F (36.8 C) (Oral)   Resp 16   Wt 86.2 kg   SpO2 95%   BMI 34.75 kg/m   Physical Exam Vitals and nursing note reviewed.  Constitutional:      Appearance: She is well-developed.  HENT:  Head: Normocephalic and atraumatic.     Mouth/Throat:     Mouth: Mucous membranes are moist.     Pharynx: Oropharynx is clear.  Eyes:     Extraocular Movements: Extraocular movements intact.     Pupils: Pupils are equal, round, and reactive to light.  Cardiovascular:     Rate and Rhythm: Normal rate and regular rhythm.  Pulmonary:     Breath sounds: Wheezing present.  Abdominal:     Palpations: Abdomen is soft.  Musculoskeletal:        General: Normal range of motion.     Cervical back: Normal range of motion  and neck supple.  Skin:    General: Skin is warm.     Capillary Refill: Capillary refill takes less than 2 seconds.  Neurological:     General: No focal deficit present.     Mental Status: She is alert and oriented to person, place, and time.  Psychiatric:        Mood and Affect: Mood normal.        Behavior: Behavior normal.     (all labs ordered are listed, but only abnormal results are displayed) Labs Reviewed  RESP PANEL BY RT-PCR (RSV, FLU A&B, COVID)  [id number]  BASIC METABOLIC PANEL WITH GFR  CBC WITH DIFFERENTIAL/PLATELET    EKG: EKG Interpretation Date/Time:  Monday May 17 2024 20:52:51 EDT Ventricular Rate:  88 PR Interval:  133 QRS Duration:  83 QT Interval:  370 QTC Calculation: 448 R Axis:   29  Text Interpretation: Sinus rhythm No significant change since last tracing Confirmed by Dean Clarity [id number]) on 05/17/2024 9:28:31 PM  Radiology: DG Chest Portable 1 View Result Date: 05/17/2024 EXAM: 1 VIEW(S) XRAY OF THE CHEST 05/17/2024 10:31:00 PM COMPARISON: Chest x-ray 192/725. CLINICAL HISTORY: SOB. 1 wk: persistent AP chest pain that radiates LT to RT, SHOB, lethargic. FINDINGS: LUNGS AND PLEURA: No focal pulmonary opacity. No pulmonary edema. No pleural effusion. No pneumothorax. HEART AND MEDIASTINUM: No acute abnormality of the cardiac and mediastinal silhouettes. BONES AND SOFT TISSUES: No acute osseous abnormality. IMPRESSION: 1. No acute process. Electronically signed by: Greig Pique MD 05/17/2024 10:34 PM EDT RP Workstation: [id number]     Procedures   Medications Ordered in the ED  albuterol  (VENTOLIN  HFA) 108 (90 Base) MCG/ACT inhaler 1-2 puff (2 puffs Inhalation Given 05/17/24 2129)  albuterol  (PROVENTIL ) (2.5 MG/3ML) 0.083% nebulizer solution 2.5 mg (has no administration in time range)  methylPREDNISolone  sodium succinate (SOLU-MEDROL ) 125 mg/2 mL injection 125 mg (125 mg Intravenous Given 05/17/24 2113)  ipratropium-albuterol  (DUONEB) 0.5-2.5  (3) MG/3ML nebulizer solution 3 mL (3 mLs Nebulization Given 05/17/24 2127)  AeroChamber Plus Flo-Vu Medium MISC 1 each (1 each Other Given 05/17/24 2129)                                    Medical Decision Making Amount and/or Complexity of Data Reviewed Labs: ordered. Radiology: ordered.  Risk Prescription drug management.   This patient presents to the ED for concern of sob, this involves an extensive number of treatment options, and is a complaint that carries with it a high risk of complications and morbidity.  The differential diagnosis includes asthma exac, pneumonia, covid/flu/rsv   Co morbidities that complicate the patient evaluation  sthma, depression, adhd, and allergies   Additional history obtained:  Additional history obtained from epic chart review  Lab Tests:  I Ordered, and  personally interpreted labs.  The pertinent results include:  cbc, bmp, covid/flu/rsv   Imaging Studies ordered:  I ordered imaging studies including cxr  I independently visualized and interpreted imaging which showed No acute process.  I agree with the radiologist interpretation   Cardiac Monitoring:  The patient was maintained on a cardiac monitor.  I personally viewed and interpreted the cardiac monitored which showed an underlying rhythm of: nsr   Medicines ordered and prescription drug management:  I ordered medication including duoneb/solumedrol  for sx  Reevaluation of the patient after these medicines showed that the patient improved I have reviewed the patients home medicines and have made adjustments as needed   Test Considered:  cxr   Problem List / ED Course:  Asthma exacerbation:  pt given nebs and steroids.  She is feeling much better.  She is stable for d/c.  She is referred to pulm as she's been here multiple times for asthma exacerbations.  She is also encouraged to not smoke.     Reevaluation:  After the interventions noted above, I reevaluated  the patient and found that they have :improved   Social Determinants of Health:  Lives at home   Dispostion:  After consideration of the diagnostic results and the patients response to treatment, I feel that the patent would benefit from discharge with outpatient f/u.       Final diagnoses:  Mild intermittent asthma with exacerbation  Viral upper respiratory tract infection    ED Discharge Orders          Ordered    albuterol  (PROVENTIL ) (2.5 MG/3ML) 0.083% nebulizer solution  Every 6 hours PRN        05/17/24 2243    albuterol  (VENTOLIN  HFA) 108 (90 Base) MCG/ACT inhaler  Every 4 hours PRN        05/17/24 2243    BREZTRI  AEROSPHERE 160-9-4.8 MCG/ACT AERO inhaler  2 times daily       Note to Pharmacy: Please use attached copay card ID [id number], GRP [id number], PCN [id number], BIN [id number]   05/17/24 2243    predniSONE  (DELTASONE ) 50 MG tablet  Daily with breakfast        05/17/24 2243    Ambulatory referral to Pulmonology       Comments: asthma   05/17/24 2245               Dean Clarity, MD 05/17/24 2248

## 2024-06-08 ENCOUNTER — Emergency Department (HOSPITAL_COMMUNITY)
Admission: EM | Admit: 2024-06-08 | Discharge: 2024-06-09 | Disposition: A | Discharge status: Home / Self Care | Attending: Emergency Medicine | Admitting: Emergency Medicine

## 2024-06-08 ENCOUNTER — Other Ambulatory Visit: Payer: Self-pay

## 2024-06-08 ENCOUNTER — Emergency Department (HOSPITAL_COMMUNITY)

## 2024-06-08 ENCOUNTER — Encounter (HOSPITAL_COMMUNITY): Payer: Self-pay

## 2024-06-08 DIAGNOSIS — Z91010 Allergy to peanuts: Secondary | ICD-10-CM | POA: Diagnosis not present

## 2024-06-08 DIAGNOSIS — Z79899 Other long term (current) drug therapy: Secondary | ICD-10-CM | POA: Insufficient documentation

## 2024-06-08 DIAGNOSIS — R0602 Shortness of breath: Secondary | ICD-10-CM | POA: Diagnosis present

## 2024-06-08 DIAGNOSIS — J4521 Mild intermittent asthma with (acute) exacerbation: Secondary | ICD-10-CM | POA: Diagnosis not present

## 2024-06-08 DIAGNOSIS — Z7951 Long term (current) use of inhaled steroids: Secondary | ICD-10-CM | POA: Diagnosis not present

## 2024-06-08 LAB — WET PREP, GENITAL
Clue Cells Wet Prep HPF POC: NONE SEEN
Sperm: NONE SEEN
Trich, Wet Prep: NONE SEEN
WBC, Wet Prep HPF POC: >=10 — AB (ref <10)
Yeast Wet Prep HPF POC: NONE SEEN

## 2024-06-08 LAB — CBC WITH DIFFERENTIAL/PLATELET
Abs Immature Granulocytes: 0.03 K/uL (ref 0.00–0.07)
Basophils Absolute: 0 K/uL (ref 0.0–0.1)
Basophils Relative: 0 %
Eosinophils Absolute: 0.5 K/uL (ref 0.0–0.5)
Eosinophils Relative: 6 %
HCT: 40.3 % (ref 36.0–46.0)
Hemoglobin: 13.4 g/dL (ref 12.0–15.0)
Immature Granulocytes: 0 %
Lymphocytes Relative: 35 %
Lymphs Abs: 2.9 K/uL (ref 0.7–4.0)
MCH: 30.5 pg (ref 26.0–34.0)
MCHC: 33.3 g/dL (ref 30.0–36.0)
MCV: 91.8 fL (ref 80.0–100.0)
Monocytes Absolute: 0.7 K/uL (ref 0.1–1.0)
Monocytes Relative: 8 %
Neutro Abs: 4 K/uL (ref 1.7–7.7)
Neutrophils Relative %: 51 %
Platelets: 320 K/uL (ref 150–400)
RBC: 4.39 MIL/uL (ref 3.87–5.11)
RDW: 12.4 % (ref 11.5–15.5)
WBC: 8.1 K/uL (ref 4.0–10.5)
nRBC: 0 % (ref 0.0–0.2)

## 2024-06-08 LAB — COMPREHENSIVE METABOLIC PANEL WITH GFR
ALT: 11 U/L (ref 0–44)
AST: 18 U/L (ref 15–41)
Albumin: 4.4 g/dL (ref 3.5–5.0)
Alkaline Phosphatase: 58 U/L (ref 38–126)
Anion gap: 9 (ref 5–15)
BUN: 10 mg/dL (ref 6–20)
CO2: 27 mmol/L (ref 22–32)
Calcium: 9.4 mg/dL (ref 8.9–10.3)
Chloride: 102 mmol/L (ref 98–111)
Creatinine, Ser: 0.6 mg/dL (ref 0.44–1.00)
GFR, Estimated: >60 mL/min (ref >60)
Glucose, Bld: 84 mg/dL (ref 70–99)
Potassium: 4.1 mmol/L (ref 3.5–5.1)
Sodium: 139 mmol/L (ref 135–145)
Total Bilirubin: 0.2 mg/dL (ref 0.0–1.2)
Total Protein: 7.4 g/dL (ref 6.5–8.1)

## 2024-06-08 MED ORDER — ALBUTEROL SULFATE (2.5 MG/3ML) 0.083% IN NEBU
2.5000 mg | INHALATION_SOLUTION | Freq: Once | RESPIRATORY_TRACT | Status: AC
  Administered 2024-06-08: 2.5 mg via RESPIRATORY_TRACT
  Filled 2024-06-08: qty 3

## 2024-06-08 MED ORDER — IPRATROPIUM-ALBUTEROL 0.5-2.5 (3) MG/3ML IN SOLN
3.0000 mL | Freq: Once | RESPIRATORY_TRACT | Status: AC
  Administered 2024-06-08: 3 mL via RESPIRATORY_TRACT
  Filled 2024-06-08: qty 3

## 2024-06-08 MED ORDER — METHYLPREDNISOLONE SODIUM SUCC 125 MG IJ SOLR
125.0000 mg | Freq: Once | INTRAMUSCULAR | Status: AC
  Administered 2024-06-08: 125 mg via INTRAVENOUS
  Filled 2024-06-08: qty 2

## 2024-06-08 MED ORDER — PREDNISONE 10 MG PO TABS: 20.0000 mg | ORAL_TABLET | Freq: Every day | ORAL | 0 refills | Status: AC

## 2024-06-08 MED ORDER — MAGNESIUM SULFATE 2 GM/50ML IV SOLN
2.0000 g | Freq: Once | INTRAVENOUS | Status: AC
  Administered 2024-06-08: 2 g via INTRAVENOUS
  Filled 2024-06-08: qty 50

## 2024-06-08 MED ORDER — ALBUTEROL SULFATE HFA 108 (90 BASE) MCG/ACT IN AERS
2.0000 | INHALATION_SPRAY | Freq: Once | RESPIRATORY_TRACT | Status: AC
  Administered 2024-06-08: 2 via RESPIRATORY_TRACT
  Filled 2024-06-08: qty 6.7

## 2024-06-08 MED ORDER — DOXYCYCLINE HYCLATE 100 MG PO CAPS
100.0000 mg | ORAL_CAPSULE | Freq: Two times a day (BID) | ORAL | 0 refills | Status: AC
Start: 2024-06-08 — End: ?

## 2024-06-08 NOTE — ED Provider Notes (Signed)
 [place] EMERG[facility]Y DE[facility]MENT AT [facility] Provider Note   CSN: [id number] Arrival date & time: 06/08/24  2034     Patient presents with: Asthma   Jacqueline [name] is a 23 y.o. female.  {Add pertinent medical, surgical, social history, OB history to YEP:67052} Patient complains of shortness of breath.  Patient has history of asthma  The history is provided by the patient and medical records. No language interpreter was used.  Asthma This is a new problem. The current episode started more than 2 days ago. The problem occurs constantly. The problem has not changed since onset.Associated symptoms include shortness of breath. Pertinent negatives include no chest pain and no abdominal pain. Nothing aggravates the symptoms. Nothing relieves the symptoms. She has tried nothing for the symptoms.       Prior to Admission medications   Medication Sig Start Date End Date Taking? Authorizing Provider  doxycycline  (VIBRAMYCIN ) 100 MG capsule Take 1 capsule (100 mg total) by mouth 2 (two) times daily. One po bid x 7 days 06/08/24  Yes Ralphie Lovelady, MD  predniSONE  (DELTASONE ) 10 MG tablet Take 2 tablets (20 mg total) by mouth daily. 06/08/24  Yes Suzette Pac, MD  acetaminophen  (TYLENOL ) 500 MG tablet Take 1 tablet (500 mg total) by mouth every 6 (six) hours as needed. 03/05/23   Nivia Colon, PA-C  albuterol  (PROVENTIL ) (2.5 MG/3ML) 0.083% nebulizer solution Take 3 mLs (2.5 mg total) by nebulization every 6 (six) hours as needed for wheezing or shortness of breath. 05/17/24   Dean Clarity, MD  albuterol  (VENTOLIN  HFA) 108 (90 Base) MCG/ACT inhaler Inhale 2 puffs into the lungs every 4 (four) hours as needed for wheezing or shortness of breath. 05/17/24   Dean Clarity, MD  azelastine  (ASTELIN ) 0.1 % nasal spray Place 1 spray into both nostrils 2 (two) times daily. Use in each nostril as directed 12/16/23   Stuart Vernell Norris, PA-C  BREZTRI  AEROSPHERE 160-9-4.8 MCG/ACT  AERO inhaler Inhale 2 puffs into the lungs in the morning and at bedtime. 05/17/24   Dean Clarity, MD  cetirizine  (ZYRTEC  ALLERGY) 10 MG tablet Take 1 tablet (10 mg total) by mouth daily. 12/16/23   Stuart Vernell Norris, PA-C  EPIPEN  2-PAK 0.3 MG/0.3ML SOAJ injection Use as directed for life threatening allergic reactions Patient not taking: Reported on 12/16/2023 02/06/23   Jeneal Danita Macintosh, MD  metroNIDAZOLE  (FLAGYL ) 500 MG tablet Take 1 tablet (500 mg total) by mouth 2 (two) times daily. 01/26/24   Vonna Sharlet POUR, MD  nitrofurantoin , macrocrystal-monohydrate, (MACROBID ) 100 MG capsule Take 1 capsule (100 mg total) by mouth 2 (two) times daily. 12/18/23   Leath-Warren, Etta PARAS, NP  promethazine -dextromethorphan (PROMETHAZINE -DM) 6.25-15 MG/5ML syrup Take 5 mLs by mouth 4 (four) times daily as needed. 12/16/23   Stuart Vernell Norris, PA-C  promethazine -dextromethorphan (PROMETHAZINE -DM) 6.25-15 MG/5ML syrup Take 5 mLs by mouth 4 (four) times daily as needed. 01/07/24   Stuart Vernell Norris, PA-C  triamcinolone  cream (KENALOG ) 0.1 % Apply 1 Application topically 2 (two) times daily. 10/27/23   Myrna Camelia HERO, NP    Allergies: Bee venom, Egg protein-containing drug products, Other, Peanut-containing drug products, and Pollen extract    Review of Systems  Constitutional:  Negative for chills and fever.  HENT:  Negative for ear pain and sore throat.   Eyes:  Negative for pain and visual disturbance.  Respiratory:  Positive for shortness of breath. Negative for cough.   Cardiovascular:  Negative for chest pain and palpitations.  Gastrointestinal:  Negative for abdominal pain and vomiting.  Genitourinary:  Negative for dysuria and hematuria.  Musculoskeletal:  Negative for arthralgias and back pain.  Skin:  Negative for color change and rash.  Neurological:  Negative for seizures and syncope.  All other systems reviewed and are negative.   Updated Vital Signs BP (!) 128/96 (BP  Location: Right Arm)   Pulse 98   Temp 99.2 F (37.3 C) (Oral)   Resp 18   Wt 86.2 kg   LMP 05/05/2024 (Approximate)   SpO2 94%   BMI 34.75 kg/m   Physical Exam Vitals and nursing note reviewed.  Constitutional:      Appearance: She is well-developed.  HENT:     Head: Normocephalic.     Nose: Nose normal.  Eyes:     General: No scleral icterus.    Conjunctiva/sclera: Conjunctivae normal.  Neck:     Thyroid: No thyromegaly.  Cardiovascular:     Rate and Rhythm: Normal rate and regular rhythm.     Heart sounds: No murmur heard.    No friction rub. No gallop.  Pulmonary:     Breath sounds: No stridor. Wheezing present. No rales.  Chest:     Chest wall: No tenderness.  Abdominal:     General: There is no distension.     Tenderness: There is no abdominal tenderness. There is no rebound.  Musculoskeletal:        General: Normal range of motion.     Cervical back: Neck supple.  Lymphadenopathy:     Cervical: No cervical adenopathy.  Skin:    Findings: No erythema or rash.  Neurological:     Mental Status: She is alert and oriented to person, place, and time.     Motor: No abnormal muscle tone.     Coordination: Coordination normal.  Psychiatric:        Behavior: Behavior normal.     (all labs ordered are listed, but only abnormal results are displayed) Labs Reviewed  WET PREP, GENITAL - Abnormal; Notable for the following components:      Result Value   WBC, Wet Prep HPF POC >=10 (*)    All other components within normal limits  CBC WITH DIFFERENTIAL/PLATELET  COMPREHENSIVE METABOLIC PANEL WITH GFR  GC/CHLAMYDIA PROBE AMP ([place]) NOT AT [facility]    EKG: None  Radiology: [facility] Chest Port 1 View Result Date: 06/08/2024 EXAM: 1 VIEW(S) XRAY OF THE CHEST 06/08/2024 09:25:00 PM COMPARISON: 05/17/2024 CLINICAL HISTORY: sob FINDINGS: LUNGS AND PLEURA: No focal pulmonary opacity. No pleural effusion. No pneumothorax. HEART AND MEDIASTINUM: No acute abnormality of  the cardiac and mediastinal silhouettes. BONES AND SOFT TISSUES: No acute osseous abnormality. IMPRESSION: 1. No acute process. Electronically signed by: Dorethia Molt MD 06/08/2024 09:55 PM EST RP Workstation: [id number]    {Document cardiac monitor, telemetry assessment procedure when appropriate:32947} Procedures   Medications Ordered in the ED  albuterol  (VENTOLIN  HFA) 108 (90 Base) MCG/ACT inhaler 2 puff (has no administration in time range)  methylPREDNISolone  sodium succinate (SOLU-MEDROL ) 125 mg/2 mL injection 125 mg (125 mg Intravenous Given 06/08/24 2143)  magnesium  sulfate IVPB 2 g 50 mL (2 g Intravenous New Bag/Given 06/08/24 2145)  ipratropium-albuterol  (DUONEB) 0.5-2.5 (3) MG/3ML nebulizer solution 3 mL (3 mLs Nebulization Given 06/08/24 2128)  albuterol  (PROVENTIL ) (2.5 MG/3ML) 0.083% nebulizer solution 2.5 mg (2.5 mg Nebulization Given 06/08/24 2128)  ipratropium-albuterol  (DUONEB) 0.5-2.5 (3) MG/3ML nebulizer solution 3 mL (3 mLs Nebulization Given 06/08/24 2320)  albuterol  (PROVENTIL ) (2.5  MG/3ML) 0.083% nebulizer solution 2.5 mg (2.5 mg Nebulization Given 06/08/24 2319)      {Click here for ABCD2, HEART and other calculators REFRESH Note before signing:1}                              Medical Decision Making Amount and/or Complexity of Data Reviewed Labs: ordered. Radiology: ordered.  Risk Prescription drug management.   Asthma exacerbation.  Patient improved with neb treatments.  She is sent home with prednisone  albuterol  and doxycycline  and will follow-up with her PCP  {Document critical care time when appropriate  Document review of labs and clinical decision tools ie CHADS2VASC2, etc  Document your independent review of radiology images and any outside records  Document your discussion with family members, caretakers and with consultants  Document social determinants of health affecting pt's care  Document your decision making why or why not admission,  treatments were needed:32947:::1}   Final diagnoses:  Mild intermittent asthma with exacerbation    ED Discharge Orders          Ordered    predniSONE  (DELTASONE ) 10 MG tablet  Daily        06/08/24 2325    doxycycline  (VIBRAMYCIN ) 100 MG capsule  2 times daily        06/08/24 2325

## 2024-06-08 NOTE — ED Triage Notes (Signed)
 Pt reports she is having an asthma flare up x 2 days and she thinks her medication got thrown away so she has nothing to use.

## 2024-06-08 NOTE — Discharge Instructions (Signed)
 Use your albuterol  inhaler only every 4-6 hours as needed and follow-up with your family doctor this week.  Return if any problems

## 2024-06-10 LAB — GC/CHLAMYDIA PROBE AMP (~~LOC~~) NOT AT ARMC
Chlamydia: NEGATIVE
Comment: NEGATIVE
Comment: NORMAL
Neisseria Gonorrhea: NEGATIVE

## 2024-06-28 ENCOUNTER — Encounter (HOSPITAL_COMMUNITY): Payer: Self-pay

## 2024-06-28 ENCOUNTER — Emergency Department (HOSPITAL_COMMUNITY)
Admission: EM | Admit: 2024-06-28 | Discharge: 2024-06-28 | Disposition: A | Discharge status: Home / Self Care | Attending: Emergency Medicine | Admitting: Emergency Medicine

## 2024-06-28 ENCOUNTER — Emergency Department (HOSPITAL_COMMUNITY)

## 2024-06-28 ENCOUNTER — Other Ambulatory Visit: Payer: Self-pay

## 2024-06-28 DIAGNOSIS — J0100 Acute maxillary sinusitis, unspecified: Secondary | ICD-10-CM

## 2024-06-28 DIAGNOSIS — N3000 Acute cystitis without hematuria: Secondary | ICD-10-CM

## 2024-06-28 LAB — RESP PANEL BY RT-PCR (RSV, FLU A&B, COVID)  RVPGX2
Influenza A by PCR: NEGATIVE
Influenza B by PCR: NEGATIVE
Resp Syncytial Virus by PCR: NEGATIVE
SARS Coronavirus 2 by RT PCR: NEGATIVE

## 2024-06-28 LAB — COMPREHENSIVE METABOLIC PANEL WITH GFR
ALT: 7 U/L (ref 0–44)
AST: 18 U/L (ref 15–41)
Albumin: 4.4 g/dL (ref 3.5–5.0)
Alkaline Phosphatase: 68 U/L (ref 38–126)
Anion gap: 11 (ref 5–15)
BUN: 10 mg/dL (ref 6–20)
CO2: 25 mmol/L (ref 22–32)
Calcium: 8.9 mg/dL (ref 8.9–10.3)
Chloride: 98 mmol/L (ref 98–111)
Creatinine, Ser: 0.79 mg/dL (ref 0.44–1.00)
GFR, Estimated: >60 mL/min (ref >60)
Glucose, Bld: 93 mg/dL (ref 70–99)
Potassium: 3.7 mmol/L (ref 3.5–5.1)
Sodium: 134 mmol/L — ABNORMAL LOW (ref 135–145)
Total Bilirubin: 0.8 mg/dL (ref 0.0–1.2)
Total Protein: 8.1 g/dL (ref 6.5–8.1)

## 2024-06-28 LAB — URINALYSIS, ROUTINE W REFLEX MICROSCOPIC
Bilirubin Urine: NEGATIVE
Glucose, UA: NEGATIVE mg/dL
Hgb urine dipstick: NEGATIVE
Ketones, ur: NEGATIVE mg/dL
Leukocytes,Ua: NEGATIVE
Nitrite: NEGATIVE
Protein, ur: 30 mg/dL — AB
Specific Gravity, Urine: 1.026 (ref 1.005–1.030)
pH: 7 (ref 5.0–8.0)

## 2024-06-28 LAB — CBC WITH DIFFERENTIAL/PLATELET
Abs Immature Granulocytes: 0.05 K/uL (ref 0.00–0.07)
Basophils Absolute: 0 K/uL (ref 0.0–0.1)
Basophils Relative: 0 %
Eosinophils Absolute: 0 K/uL (ref 0.0–0.5)
Eosinophils Relative: 0 %
HCT: 41.1 % (ref 36.0–46.0)
Hemoglobin: 13.7 g/dL (ref 12.0–15.0)
Immature Granulocytes: 1 %
Lymphocytes Relative: 14 %
Lymphs Abs: 1.6 K/uL (ref 0.7–4.0)
MCH: 30.6 pg (ref 26.0–34.0)
MCHC: 33.3 g/dL (ref 30.0–36.0)
MCV: 91.7 fL (ref 80.0–100.0)
Monocytes Absolute: 0.7 K/uL (ref 0.1–1.0)
Monocytes Relative: 6 %
Neutro Abs: 8.5 K/uL — ABNORMAL HIGH (ref 1.7–7.7)
Neutrophils Relative %: 79 %
Platelets: 303 K/uL (ref 150–400)
RBC: 4.48 MIL/uL (ref 3.87–5.11)
RDW: 12.3 % (ref 11.5–15.5)
WBC: 10.8 K/uL — ABNORMAL HIGH (ref 4.0–10.5)
nRBC: 0 % (ref 0.0–0.2)

## 2024-06-28 LAB — HCG, QUANTITATIVE, PREGNANCY: hCG, Beta Chain, Quant, S: <1 m[IU]/mL (ref <5)

## 2024-06-28 MED ORDER — SODIUM CHLORIDE 0.9 % IV BOLUS
1000.0000 mL | Freq: Once | INTRAVENOUS | Status: AC
  Administered 2024-06-28: 1000 mL via INTRAVENOUS

## 2024-06-28 MED ORDER — AMOXICILLIN 500 MG PO CAPS: 500.0000 mg | ORAL_CAPSULE | Freq: Three times a day (TID) | ORAL | 0 refills | Status: AC

## 2024-06-28 MED ORDER — ACETAMINOPHEN 325 MG PO TABS
650.0000 mg | ORAL_TABLET | Freq: Once | ORAL | Status: AC
  Administered 2024-06-28: 650 mg via ORAL
  Filled 2024-06-28: qty 2

## 2024-06-28 MED ORDER — ALBUTEROL SULFATE HFA 108 (90 BASE) MCG/ACT IN AERS
2.0000 | INHALATION_SPRAY | Freq: Once | RESPIRATORY_TRACT | Status: AC
  Administered 2024-06-28: 2 via RESPIRATORY_TRACT
  Filled 2024-06-28: qty 6.7

## 2024-06-28 NOTE — ED Triage Notes (Signed)
 Pt to ED from home with c/o congestion, headache, sinus pain, and asthma that started this morning.

## 2024-06-28 NOTE — Discharge Instructions (Signed)
 Drink plenty of fluids, follow-up with your doctor if not improving.  Take Tylenol  for any fever or aches

## 2024-06-30 LAB — URINE CULTURE

## 2024-06-30 NOTE — ED Provider Notes (Signed)
 [place] EMERGENCY DEPARTMENT AT [facility] Provider Note   CSN: [id number] Arrival date & time: 06/28/24  8078     Patient presents with: Nasal Congestion and Fever   Jacqueline [name] is a 23 y.o. female.   Patient complains of a fever and a cough.  No vomiting no shortness of breath  The history is provided by the patient and medical records.  Fever Max temp prior to arrival:  101 Temp source:  Oral Severity:  Moderate Onset quality:  Sudden Timing:  Constant Progression:  Waxing and waning Chronicity:  New Relieved by:  Nothing Worsened by:  Nothing Ineffective treatments:  None tried Associated symptoms: no chest pain, no congestion, no cough, no diarrhea, no headaches and no rash        Prior to Admission medications   Medication Sig Start Date End Date Taking? Authorizing Provider  amoxicillin  (AMOXIL ) 500 MG capsule Take 1 capsule (500 mg total) by mouth 3 (three) times daily. 06/28/24  Yes Suzette Pac, MD  acetaminophen  (TYLENOL ) 500 MG tablet Take 1 tablet (500 mg total) by mouth every 6 (six) hours as needed. 03/05/23   Nivia Colon, PA-C  albuterol  (PROVENTIL ) (2.5 MG/3ML) 0.083% nebulizer solution Take 3 mLs (2.5 mg total) by nebulization every 6 (six) hours as needed for wheezing or shortness of breath. 05/17/24   Dean Clarity, MD  albuterol  (VENTOLIN  HFA) 108 (90 Base) MCG/ACT inhaler Inhale 2 puffs into the lungs every 4 (four) hours as needed for wheezing or shortness of breath. 05/17/24   Dean Clarity, MD  azelastine  (ASTELIN ) 0.1 % nasal spray Place 1 spray into both nostrils 2 (two) times daily. Use in each nostril as directed 12/16/23   Stuart Vernell Norris, PA-C  BREZTRI  AEROSPHERE 160-9-4.8 MCG/ACT AERO inhaler Inhale 2 puffs into the lungs in the morning and at bedtime. 05/17/24   Dean Clarity, MD  cetirizine  (ZYRTEC  ALLERGY) 10 MG tablet Take 1 tablet (10 mg total) by mouth daily. 12/16/23   Stuart Vernell Norris, PA-C   doxycycline  (VIBRAMYCIN ) 100 MG capsule Take 1 capsule (100 mg total) by mouth 2 (two) times daily. One po bid x 7 days 06/08/24   Suzette Pac, MD  EPIPEN  2-PAK 0.3 MG/0.3ML SOAJ injection Use as directed for life threatening allergic reactions Patient not taking: Reported on 12/16/2023 02/06/23   Jeneal Danita Macintosh, MD  metroNIDAZOLE  (FLAGYL ) 500 MG tablet Take 1 tablet (500 mg total) by mouth 2 (two) times daily. 01/26/24   Vonna Sharlet POUR, MD  nitrofurantoin , macrocrystal-monohydrate, (MACROBID ) 100 MG capsule Take 1 capsule (100 mg total) by mouth 2 (two) times daily. 12/18/23   Leath-Warren, Etta PARAS, NP  predniSONE  (DELTASONE ) 10 MG tablet Take 2 tablets (20 mg total) by mouth daily. 06/08/24   Alessia Gonsalez, MD  promethazine -dextromethorphan (PROMETHAZINE -DM) 6.25-15 MG/5ML syrup Take 5 mLs by mouth 4 (four) times daily as needed. 12/16/23   Stuart Vernell Norris, PA-C  promethazine -dextromethorphan (PROMETHAZINE -DM) 6.25-15 MG/5ML syrup Take 5 mLs by mouth 4 (four) times daily as needed. 01/07/24   Stuart Vernell Norris, PA-C  triamcinolone  cream (KENALOG ) 0.1 % Apply 1 Application topically 2 (two) times daily. 10/27/23   Myrna Camelia HERO, NP    Allergies: Bee venom, Egg protein-containing drug products, Other, Peanut-containing drug products, and Pollen extract    Review of Systems  Constitutional:  Positive for fever. Negative for appetite change and fatigue.  HENT:  Negative for congestion, ear discharge and sinus pressure.   Eyes:  Negative for discharge.  Respiratory:  Negative for cough.   Cardiovascular:  Negative for chest pain.  Gastrointestinal:  Negative for abdominal pain and diarrhea.  Genitourinary:  Negative for frequency and hematuria.  Musculoskeletal:  Negative for back pain.  Skin:  Negative for rash.  Neurological:  Negative for seizures and headaches.  Psychiatric/Behavioral:  Negative for hallucinations.     Updated Vital Signs BP 123/79   Pulse  90   Temp 98.9 F (37.2 C)   Resp 18   Wt 86.2 kg   LMP 05/05/2024 (Approximate)   SpO2 94%   BMI 34.75 kg/m   Physical Exam Vitals and nursing note reviewed.  Constitutional:      Appearance: She is well-developed.  HENT:     Head: Normocephalic.     Nose: Nose normal.  Eyes:     General: No scleral icterus.    Conjunctiva/sclera: Conjunctivae normal.  Neck:     Thyroid: No thyromegaly.  Cardiovascular:     Rate and Rhythm: Normal rate and regular rhythm.     Heart sounds: No murmur heard.    No friction rub. No gallop.  Pulmonary:     Breath sounds: No stridor. No wheezing or rales.  Chest:     Chest wall: No tenderness.  Abdominal:     General: There is no distension.     Tenderness: There is no abdominal tenderness. There is no rebound.  Musculoskeletal:        General: Normal range of motion.     Cervical back: Neck supple.  Lymphadenopathy:     Cervical: No cervical adenopathy.  Skin:    Findings: No erythema or rash.  Neurological:     Mental Status: She is alert and oriented to person, place, and time.     Motor: No abnormal muscle tone.     Coordination: Coordination normal.  Psychiatric:        Behavior: Behavior normal.     (all labs ordered are listed, but only abnormal results are displayed) Labs Reviewed  URINE CULTURE - Abnormal; Notable for the following components:      Result Value   Culture MULTIPLE SPECIES PRESENT, SUGGEST RECOLLECTION (*)    All other components within normal limits  URINALYSIS, ROUTINE W REFLEX MICROSCOPIC - Abnormal; Notable for the following components:   APPearance HAZY (*)    Protein, ur 30 (*)    Bacteria, UA MANY (*)    All other components within normal limits  CBC WITH DIFFERENTIAL/PLATELET - Abnormal; Notable for the following components:   WBC 10.8 (*)    Neutro Abs 8.5 (*)    All other components within normal limits  COMPREHENSIVE METABOLIC PANEL WITH GFR - Abnormal; Notable for the following  components:   Sodium 134 (*)    All other components within normal limits  RESP PANEL BY RT-PCR (RSV, FLU A&B, COVID)  [id number]  HCG, QUANTITATIVE, PREGNANCY    EKG: None  Radiology: DG Chest Port 1 View Result Date: 06/28/2024 EXAM: 1 VIEW(S) XRAY OF THE CHEST 06/28/2024 07:52:00 PM COMPARISON: Portable chest 06/08/2024. CLINICAL HISTORY: SOB. FINDINGS: LUNGS AND PLEURA: No focal pulmonary opacity. No pleural effusion. No pneumothorax. HEART AND MEDIASTINUM: No acute abnormality of the cardiac and mediastinal silhouettes. BONES AND SOFT TISSUES: No acute osseous abnormality. There is a linear artifact overlying the upper left chest, probably from overlying clothing. IMPRESSION: 1. No acute cardiopulmonary process. Stable chest. Electronically signed by: Francis Quam MD 06/28/2024 08:03 PM EST RP Workstation: [id number]  Procedures   Medications Ordered in the ED  sodium chloride  0.9 % bolus 1,000 mL (0 mLs Intravenous Stopped 06/28/24 2106)  acetaminophen  (TYLENOL ) tablet 650 mg (650 mg Oral Given 06/28/24 1944)  albuterol  (VENTOLIN  HFA) 108 (90 Base) MCG/ACT inhaler 2 puff (2 puffs Inhalation Given 06/28/24 2148)                                    Medical Decision Making Amount and/or Complexity of Data Reviewed Labs: ordered. Radiology: ordered.  Risk OTC drugs. Prescription drug management.   Patient with respiratory infection and possible UTI.  She is put on amoxicillin  and will follow-up with PCP     Final diagnoses:  Acute maxillary sinusitis, recurrence not specified  Acute cystitis without hematuria    ED Discharge Orders          Ordered    amoxicillin  (AMOXIL ) 500 MG capsule  3 times daily        06/28/24 2128               Suzette Pac, MD 06/30/24 1229
